# Patient Record
Sex: Female | Born: 1990 | Race: Black or African American | Hispanic: No | Marital: Single | Smoking: Never smoker
Health system: Southern US, Community
[De-identification: ages and names within clinical notes are randomized; demographics above are authoritative.]

## PROBLEM LIST (undated history)

## (undated) ENCOUNTER — Inpatient Hospital Stay (HOSPITAL_COMMUNITY): Payer: Self-pay

## (undated) DIAGNOSIS — J45909 Unspecified asthma, uncomplicated: Secondary | ICD-10-CM

## (undated) DIAGNOSIS — B999 Unspecified infectious disease: Secondary | ICD-10-CM

## (undated) DIAGNOSIS — D649 Anemia, unspecified: Secondary | ICD-10-CM

## (undated) HISTORY — PX: KNEE SURGERY: SHX244

## (undated) HISTORY — PX: KNEE ARTHROSCOPY: SUR90

---
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
## 2012-12-28 ENCOUNTER — Emergency Department (HOSPITAL_COMMUNITY)
Admission: EM | Admit: 2012-12-28 | Discharge: 2012-12-28 | Disposition: A | Discharge status: Home / Self Care | Payer: 59 | Attending: Emergency Medicine | Admitting: Emergency Medicine

## 2012-12-28 ENCOUNTER — Encounter (HOSPITAL_COMMUNITY): Payer: Self-pay | Admitting: Emergency Medicine

## 2012-12-28 DIAGNOSIS — Z79899 Other long term (current) drug therapy: Secondary | ICD-10-CM | POA: Insufficient documentation

## 2012-12-28 DIAGNOSIS — F3289 Other specified depressive episodes: Secondary | ICD-10-CM | POA: Insufficient documentation

## 2012-12-28 DIAGNOSIS — R350 Frequency of micturition: Secondary | ICD-10-CM | POA: Insufficient documentation

## 2012-12-28 DIAGNOSIS — F329 Major depressive disorder, single episode, unspecified: Secondary | ICD-10-CM | POA: Insufficient documentation

## 2012-12-28 DIAGNOSIS — Z3202 Encounter for pregnancy test, result negative: Secondary | ICD-10-CM | POA: Insufficient documentation

## 2012-12-28 DIAGNOSIS — N390 Urinary tract infection, site not specified: Secondary | ICD-10-CM | POA: Insufficient documentation

## 2012-12-28 LAB — URINALYSIS, ROUTINE W REFLEX MICROSCOPIC
Glucose, UA: NEGATIVE mg/dL
Ketones, ur: NEGATIVE mg/dL
Protein, ur: >300 mg/dL — AB
pH: 5.5 (ref 5.0–8.0)

## 2012-12-28 LAB — URINE MICROSCOPIC-ADD ON

## 2012-12-28 LAB — PREGNANCY, URINE: Preg Test, Ur: NEGATIVE

## 2012-12-28 MED ORDER — CEPHALEXIN 500 MG PO CAPS
500.0000 mg | ORAL_CAPSULE | Freq: Four times a day (QID) | ORAL | Status: DC
  Administered 2012-12-28: 500 mg via ORAL
  Filled 2012-12-28: qty 1

## 2012-12-28 MED ORDER — CEPHALEXIN 500 MG PO CAPS: 500.0000 mg | ORAL_CAPSULE | Freq: Four times a day (QID) | ORAL | Status: DC

## 2012-12-28 MED ORDER — PHENAZOPYRIDINE HCL 200 MG PO TABS: 200.0000 mg | ORAL_TABLET | Freq: Three times a day (TID) | ORAL | Status: DC

## 2012-12-28 MED ORDER — PHENAZOPYRIDINE HCL 200 MG PO TABS
200.0000 mg | ORAL_TABLET | Freq: Three times a day (TID) | ORAL | Status: DC
  Administered 2012-12-28: 200 mg via ORAL
  Filled 2012-12-28: qty 1

## 2012-12-28 NOTE — ED Provider Notes (Addendum)
CSN: [id number]     Arrival date & time 12/28/12  0056 History   First MD Initiated Contact with Patient 12/28/12 0131     Chief Complaint  Patient presents with  . Pelvic Pain    Patient is a 22 y.o. female presenting with pelvic pain. The history is provided by the patient.  Pelvic Pain This is a new problem. The current episode started 6 to 12 hours ago. The problem occurs constantly. The problem has not changed since onset.Associated symptoms include abdominal pain. Pertinent negatives include no chest pain, no headaches and no shortness of breath. Exacerbated by: urination. Nothing relieves the symptoms. She has tried nothing for the symptoms.    History reviewed. No pertinent past medical history. Past Surgical History  Procedure Laterality Date  . Knee surgery     Family History  Problem Relation Age of Onset  . Cancer Other    History  Substance Use Topics  . Smoking status: Never Smoker   . Smokeless tobacco: Not on file  . Alcohol Use: No   OB History   Grav Para Term Preterm Abortions TAB SAB Ect Mult Living                 Review of Systems  Constitutional: Negative for fever.  Respiratory: Negative for shortness of breath.   Cardiovascular: Negative for chest pain.  Gastrointestinal: Positive for abdominal pain. Negative for nausea, vomiting and diarrhea.  Genitourinary: Positive for dysuria, frequency and pelvic pain. Negative for flank pain.  Skin: Negative for rash.  Neurological: Negative for headaches.  All other systems reviewed and are negative.    Allergies  Review of patient's allergies indicates no known allergies.  Home Medications   Current Outpatient Rx  Name  Route  Sig  Dispense  Refill  . Estradiol Valerate-Dienogest (NATAZIA) 3/2-2/2-3/1 MG tablet   Oral   Take 1 tablet by mouth daily.         . cephALEXin (KEFLEX) 500 MG capsule   Oral   Take 1 capsule (500 mg total) by mouth every 6 (six) hours.   20 capsule   0   .  phenazopyridine (PYRIDIUM) 200 MG tablet   Oral   Take 1 tablet (200 mg total) by mouth 3 (three) times daily with meals.   10 tablet   0    BP 113/75  Pulse 87  Temp(Src) 98.1 F (36.7 C) (Oral)  Resp 14  SpO2 100%  LMP 11/15/2012 Physical Exam  Nursing note and vitals reviewed. Constitutional: She appears well-developed and well-nourished. No distress.  HENT:  Head: Normocephalic and atraumatic.  Right Ear: External ear normal.  Left Ear: External ear normal.  Eyes: Conjunctivae are normal. Right eye exhibits no discharge. Left eye exhibits no discharge. No scleral icterus.  Neck: Neck supple. No tracheal deviation present.  Cardiovascular: Normal rate.   Pulmonary/Chest: Effort normal. No stridor. No respiratory distress.  Abdominal: She exhibits no mass. There is no tenderness. There is no rebound and no guarding.  Musculoskeletal: She exhibits no edema.  Neurological: She is alert. Cranial nerve deficit: no gross deficits.  Skin: Skin is warm and dry. No rash noted.  Psychiatric: She has a normal mood and affect.    ED Course  Procedures (including critical care time) Labs Review Labs Reviewed  URINALYSIS, ROUTINE W REFLEX MICROSCOPIC - Abnormal; Notable for the following:    Color, Urine AMBER (*)    APPearance TURBID (*)    Specific Gravity, Urine 1.033 (*)  Hgb urine dipstick LARGE (*)    Bilirubin Urine SMALL (*)    Protein, ur >300 (*)    Leukocytes, UA MODERATE (*)    All other components within normal limits  URINE MICROSCOPIC-ADD ON - Abnormal; Notable for the following:    Squamous Epithelial / LPF FEW (*)    Bacteria, UA MANY (*)    All other components within normal limits  URINE CULTURE  PREGNANCY, URINE   Imaging Review No results found.  MDM   1. UTI (urinary tract infection)    Symptoms and findings are consistent with a simple cystitis. We'll discharge the patient home on a prescription for Keflex and 30    Celene Kras,  MD 12/28/12 4098  Celene Kras, MD 01/02/13 [phone]

## 2012-12-28 NOTE — ED Notes (Signed)
Pt states she is having pain in her lower abd/pelvic area describes as pressure  Pt states it started around 1900 tonight  Pt states she feels like she needs to urinate all the time and only a little comes out  Pt has burning with urination

## 2012-12-30 LAB — URINE CULTURE

## 2012-12-31 NOTE — ED Notes (Signed)
Patient treated with Cephalexin-sensitive to same-chart appended per protocol MD. 

## 2013-04-04 NOTE — L&D Delivery Note (Signed)
Operative Delivery Note At 8:48 PM a viable and healthy female was delivered via Vaginal, Vacuum Investment banker, operational).  Presentation: vertex; Position: Occiput,, Posterior; Station: +3.  Verbal consent: obtained from patient.  Risks and benefits discussed in detail.  Risks include, but are not limited to the risks of anesthesia, bleeding, infection, damage to maternal tissues, fetal cephalhematoma.  There is also the risk of inability to effect vaginal delivery of the head, or shoulder dystocia that cannot be resolved by established maneuvers, leading to the need for emergency cesarean section.  Patient was noted to be having severe variables with contractions. These became more severe with prolonged pushing. The decision was made to expedite delivery. Following informed consent the vacuum was applied. With pushing the vacuum suction was engaged and gentle traction was applied to assist with delivering the fetal vertex. The vacuum popped off approximately 4 times prior to achieving delivery of the head. It was difficult to get an adequate application and suction of the vacuum to to the amount of hair on the infant's head. After the second pop-off a second-degree episiotomy was performed. With the final application of the vacuum the infant's head was delivered to crowning in the direct occiput posterior presentation. The vacuum was disengaged and the remainder of the body was delivered. The infant was limp and the cord was immediately clamped and cut and the infant was passed to the waiting isolette for resuscitation and a code Apgar was called. Please see the neonatal resuscitation record for complete details. The NICU team deemed the infant stable to remain at bedside with the mother. Mother and baby are doing well following delivery.  APGAR: 4, 7; weight pending.   Placenta status: spontaneous, intact.   Cord: 3 vessels with the following complications: .  Cord pH: 6.91. At the end of the cord gas draw there  appeared to be venous contamination of the blood draw  Anesthesia: Epidural Local  Instruments: kiwi vacuum Episiotomy: Median Lacerations: 2nd degree Suture Repair: 3.0 vicryl Est. Blood Loss (mL): 300  Mom to postpartum.  Baby to Couplet care / Skin to Skin.  Tya Haughey H. 11/26/2013, 9:18 PM

## 2013-05-10 LAB — OB RESULTS CONSOLE RPR: RPR: NONREACTIVE

## 2013-05-10 LAB — OB RESULTS CONSOLE GC/CHLAMYDIA
CHLAMYDIA, DNA PROBE: NEGATIVE
GC PROBE AMP, GENITAL: NEGATIVE

## 2013-05-10 LAB — OB RESULTS CONSOLE ABO/RH: "RH Type ": POSITIVE

## 2013-05-10 LAB — OB RESULTS CONSOLE HIV ANTIBODY (ROUTINE TESTING): HIV: NONREACTIVE

## 2013-05-10 LAB — OB RESULTS CONSOLE HEPATITIS B SURFACE ANTIGEN: Hepatitis B Surface Ag: NEGATIVE

## 2013-05-10 LAB — OB RESULTS CONSOLE ANTIBODY SCREEN: Antibody Screen: NEGATIVE

## 2013-05-10 LAB — OB RESULTS CONSOLE RUBELLA ANTIBODY, IGM: Rubella: IMMUNE

## 2013-10-08 ENCOUNTER — Encounter (HOSPITAL_COMMUNITY): Payer: Self-pay | Admitting: *Deleted

## 2013-10-08 ENCOUNTER — Inpatient Hospital Stay (HOSPITAL_COMMUNITY)
Admission: AD | Admit: 2013-10-08 | Discharge: 2013-10-09 | Disposition: A | Discharge status: Home / Self Care | Payer: 59 | Source: Ambulatory Visit | Attending: Obstetrics and Gynecology | Admitting: Obstetrics and Gynecology

## 2013-10-08 DIAGNOSIS — N898 Other specified noninflammatory disorders of vagina: Secondary | ICD-10-CM | POA: Insufficient documentation

## 2013-10-08 DIAGNOSIS — N949 Unspecified condition associated with female genital organs and menstrual cycle: Secondary | ICD-10-CM | POA: Diagnosis present

## 2013-10-08 DIAGNOSIS — O4700 False labor before 37 completed weeks of gestation, unspecified trimester: Secondary | ICD-10-CM | POA: Diagnosis not present

## 2013-10-08 DIAGNOSIS — O26893 Other specified pregnancy related conditions, third trimester: Secondary | ICD-10-CM

## 2013-10-08 DIAGNOSIS — O479 False labor, unspecified: Secondary | ICD-10-CM

## 2013-10-08 LAB — WET PREP, GENITAL
CLUE CELLS WET PREP: NONE SEEN
TRICH WET PREP: NONE SEEN
Yeast Wet Prep HPF POC: NONE SEEN

## 2013-10-08 LAB — URINALYSIS, ROUTINE W REFLEX MICROSCOPIC
BILIRUBIN URINE: NEGATIVE
GLUCOSE, UA: NEGATIVE mg/dL
Hgb urine dipstick: NEGATIVE
Ketones, ur: NEGATIVE mg/dL
LEUKOCYTES UA: NEGATIVE
Nitrite: NEGATIVE
Protein, ur: NEGATIVE mg/dL
SPECIFIC GRAVITY, URINE: 1.02 (ref 1.005–1.030)
Urobilinogen, UA: 0.2 mg/dL (ref 0.0–1.0)
pH: 7.5 (ref 5.0–8.0)

## 2013-10-08 NOTE — MAU Note (Signed)
Pt c/o having cxt's and tan colored vaginal discharge for past two days.  C/o abdominal pain 5/10 on the pain scale.

## 2013-10-08 NOTE — MAU Note (Signed)
Pt reports having 3 contractions today, lower back pain and vaginal discharge x 3 days. Reports good fetal movement

## 2013-10-08 NOTE — MAU Provider Note (Signed)
Chief Complaint:  Contractions, Vaginal Discharge, Back Pain and Pelvic Pain   First Provider Initiated Contact with Patient 10/08/13 2310      HPI: Danielle Christensen is a 23 y.o. G1P0 at 7723w2d who presents to maternity admissions reporting pelvic pain/pressure, back pain, cramping, and white vaginal discharge x2 days.  She denies itching/burning/odor with the discharge.  She reports good fetal movement, denies LOF, vaginal bleeding, urinary symptoms, h/a, dizziness, n/v, or fever/chills.    Past Medical History: No past medical history on file.  Past obstetric history: OB History  Gravida Para Term Preterm AB SAB TAB Ectopic Multiple Living  2 1            # Outcome Date GA Lbr Len/2nd Weight Sex Delivery Anes PTL Lv  2 CUR           1 PAR               Past Surgical History: Past Surgical History  Procedure Laterality Date  . Knee surgery      Family History: Family History  Problem Relation Age of Onset  . Cancer Other     Social History: History  Substance Use Topics  . Smoking status: Never Smoker   . Smokeless tobacco: Not on file  . Alcohol Use: No    Allergies: No Known Allergies  Meds:  Prescriptions prior to admission  Medication Sig Dispense Refill  . albuterol (PROVENTIL HFA;VENTOLIN HFA) 108 (90 BASE) MCG/ACT inhaler Inhale 2 puffs into the lungs every 6 (six) hours as needed for wheezing or shortness of breath.      . Prenatal Vit-Fe Fumarate-FA (PRENATAL MULTIVITAMIN) TABS tablet Take 2 tablets by mouth daily at 12 noon.        ROS: Pertinent findings in history of present illness.  Physical Exam  Blood pressure 122/64, pulse 100, temperature 98.6 F (37 C), temperature source Oral, resp. rate 18, height 5\' 4"  (1.626 m), weight 65.772 kg (145 lb), last menstrual period 11/15/2012, SpO2 99.00%. GENERAL: Well-developed, well-nourished female in no acute distress.  HEENT: normocephalic HEART: normal rate RESP: normal effort ABDOMEN: Soft,  non-tender, gravid appropriate for gestational age EXTREMITIES: Nontender, no edema NEURO: alert and oriented Pelvic exam: Cervix pink, visually closed, without lesion, scant white creamy discharge, vaginal walls and external genitalia normal Cervix 0/thick/high/posterior  Dilation: Closed Effacement (%): Thick Exam by:: Leftwich-Kirby  FHT:  Baseline 140 , moderate variability, accelerations present, no decelerations Contractions: Some intermittent irritability, mild to palpation   Labs: Results for orders placed during the hospital encounter of 10/08/13 (from the past 24 hour(s))  URINALYSIS, ROUTINE W REFLEX MICROSCOPIC     Status: None   Collection Time    10/08/13  8:06 PM      Result Value Ref Range   Color, Urine YELLOW  YELLOW   APPearance CLEAR  CLEAR   Specific Gravity, Urine 1.020  1.005 - 1.030   pH 7.5  5.0 - 8.0   Glucose, UA NEGATIVE  NEGATIVE mg/dL   Hgb urine dipstick NEGATIVE  NEGATIVE   Bilirubin Urine NEGATIVE  NEGATIVE   Ketones, ur NEGATIVE  NEGATIVE mg/dL   Protein, ur NEGATIVE  NEGATIVE mg/dL   Urobilinogen, UA 0.2  0.0 - 1.0 mg/dL   Nitrite NEGATIVE  NEGATIVE   Leukocytes, UA NEGATIVE  NEGATIVE  WET PREP, GENITAL     Status: Abnormal   Collection Time    10/08/13 11:20 PM      Result Value Ref Range  Yeast Wet Prep HPF POC NONE SEEN  NONE SEEN   Trich, Wet Prep NONE SEEN  NONE SEEN   Clue Cells Wet Prep HPF POC NONE SEEN  NONE SEEN   WBC, Wet Prep HPF POC FEW (*) NONE SEEN    Assessment: 1. Braxton Hicks contractions   2. Pelvic pressure in pregnancy, antepartum, third trimester     Plan: Discharge home PTL precautions and fetal kick counts Keep scheduled appt this week Return to MAU as needed for emergencies      Follow-up Information   Follow up with Levi AlandANDERSON,MARK E, MD.   Specialty:  Obstetrics and Gynecology   Contact information:   [address] [place] [id number] [phone]        Medication List          albuterol 108 (90 BASE) MCG/ACT inhaler  Commonly known as:  PROVENTIL HFA;VENTOLIN HFA  Inhale 2 puffs into the lungs every 6 (six) hours as needed for wheezing or shortness of breath.     prenatal multivitamin Tabs tablet  Take 2 tablets by mouth daily at 12 noon.        Sharen CounterLisa Leftwich-Kirby Certified Nurse-Midwife 10/08/2013 11:44 PM

## 2013-10-08 NOTE — Discharge Instructions (Signed)
Braxton Hicks Contractions °Contractions of the uterus can occur throughout pregnancy. Contractions are not always a sign that you are in labor.  °WHAT ARE BRAXTON HICKS CONTRACTIONS?  °Contractions that occur before labor are called Braxton Hicks contractions, or false labor. Toward the end of pregnancy (32-34 weeks), these contractions can develop more often and may become more forceful. This is not true labor because these contractions do not result in opening (dilatation) and thinning of the cervix. They are sometimes difficult to tell apart from true labor because these contractions can be forceful and people have different pain tolerances. You should not feel embarrassed if you go to the hospital with false labor. Sometimes, the only way to tell if you are in true labor is for your health care provider to look for changes in the cervix. °If there are no prenatal problems or other health problems associated with the pregnancy, it is completely safe to be sent home with false labor and await the onset of true labor. °HOW CAN YOU TELL THE DIFFERENCE BETWEEN TRUE AND FALSE LABOR? °False Labor °· The contractions of false labor are usually shorter and not as hard as those of true labor.   °· The contractions are usually irregular.   °· The contractions are often felt in the front of the lower abdomen and in the groin.   °· The contractions may go away when you walk around or change positions while lying down.   °· The contractions get weaker and are shorter lasting as time goes on.   °· The contractions do not usually become progressively stronger, regular, and closer together as with true labor.   °True Labor °· Contractions in true labor last 30-70 seconds, become very regular, usually become more intense, and increase in frequency.   °· The contractions do not go away with walking.   °· The discomfort is usually felt in the top of the uterus and spreads to the lower abdomen and low back.   °· True labor can be  determined by your health care provider with an exam. This will show that the cervix is dilating and getting thinner.   °WHAT TO REMEMBER °· Keep up with your usual exercises and follow other instructions given by your health care provider.   °· Take medicines as directed by your health care provider.   °· Keep your regular prenatal appointments.   °· Eat and drink lightly if you think you are going into labor.   °· If Braxton Hicks contractions are making you uncomfortable:   °¨ Change your position from lying down or resting to walking, or from walking to resting.   °¨ Sit and rest in a tub of warm water.   °¨ Drink 2-3 glasses of water. Dehydration may cause these contractions.   °¨ Do slow and deep breathing several times an hour.   °WHEN SHOULD I SEEK IMMEDIATE MEDICAL CARE? °Seek immediate medical care if: °· Your contractions become stronger, more regular, and closer together.   °· You have fluid leaking or gushing from your vagina.   °· You have a fever.   °· You pass blood-tinged mucus.   °· You have vaginal bleeding.   °· You have continuous abdominal pain.   °· You have low back pain that you never had before.   °· You feel your baby's head pushing down and causing pelvic pressure.   °· Your baby is not moving as much as it used to.   °Document Released: 03/21/2005 Document Revised: 03/26/2013 Document Reviewed: 12/31/2012 °[employer]® Patient Information ©2015 [employer]. This information is not intended to replace advice given to you by your health care   provider. Make sure you discuss any questions you have with your health care provider. ° °

## 2013-10-24 LAB — OB RESULTS CONSOLE GBS: STREP GROUP B AG: NEGATIVE

## 2013-11-24 ENCOUNTER — Inpatient Hospital Stay (HOSPITAL_COMMUNITY)
Admission: AD | Admit: 2013-11-24 | Discharge: 2013-11-24 | Disposition: A | Discharge status: Home / Self Care | Payer: 59 | Source: Ambulatory Visit | Attending: Rheumatology | Admitting: Rheumatology

## 2013-11-24 ENCOUNTER — Encounter (HOSPITAL_COMMUNITY): Payer: Self-pay | Admitting: *Deleted

## 2013-11-24 DIAGNOSIS — O479 False labor, unspecified: Secondary | ICD-10-CM | POA: Insufficient documentation

## 2013-11-24 NOTE — MAU Note (Signed)
Pt presented after SROM at 0415. Pt states that fluid is clear and denies bleeding. Baby has been active. Pt is not having contractions.

## 2013-11-24 NOTE — MAU Note (Addendum)
Pt said she is having contractions having started yesterday at 1500.

## 2013-11-24 NOTE — Discharge Instructions (Signed)
Braxton Hicks Contractions Contractions of the uterus can occur throughout pregnancy. Contractions are not always a sign that you are in labor.  WHAT ARE BRAXTON HICKS CONTRACTIONS?  Contractions that occur before labor are called Braxton Hicks contractions, or false labor. Toward the end of pregnancy (32-34 weeks), these contractions can develop more often and may become more forceful. This is not true labor because these contractions do not result in opening (dilatation) and thinning of the cervix. They are sometimes difficult to tell apart from true labor because these contractions can be forceful and people have different pain tolerances. You should not feel embarrassed if you go to the hospital with false labor. Sometimes, the only way to tell if you are in true labor is for your health care provider to look for changes in the cervix. If there are no prenatal problems or other health problems associated with the pregnancy, it is completely safe to be sent home with false labor and await the onset of true labor. HOW CAN YOU TELL THE DIFFERENCE BETWEEN TRUE AND FALSE LABOR? False Labor  The contractions of false labor are usually shorter and not as hard as those of true labor.   The contractions are usually irregular.   The contractions are often felt in the front of the lower abdomen and in the groin.   The contractions may go away when you walk around or change positions while lying down.   The contractions get weaker and are shorter lasting as time goes on.   The contractions do not usually become progressively stronger, regular, and closer together as with true labor.  True Labor  Contractions in true labor last 30-70 seconds, become very regular, usually become more intense, and increase in frequency.   The contractions do not go away with walking.   The discomfort is usually felt in the top of the uterus and spreads to the lower abdomen and low back.   True labor can be  determined by your health care provider with an exam. This will show that the cervix is dilating and getting thinner.  WHAT TO REMEMBER  Keep up with your usual exercises and follow other instructions given by your health care provider.   Take medicines as directed by your health care provider.   Keep your regular prenatal appointments.   Eat and drink lightly if you think you are going into labor.   If Braxton Hicks contractions are making you uncomfortable:   Change your position from lying down or resting to walking, or from walking to resting.   Sit and rest in a tub of warm water.   Drink 2-3 glasses of water. Dehydration may cause these contractions.   Do slow and deep breathing several times an hour.  WHEN SHOULD I SEEK IMMEDIATE MEDICAL CARE? Seek immediate medical care if:  Your contractions become stronger, more regular, and closer together.   You have fluid leaking or gushing from your vagina.   You have a fever.   You pass blood-tinged mucus.   You have vaginal bleeding.   You have continuous abdominal pain.   You have low back pain that you never had before.   You feel your baby's head pushing down and causing pelvic pressure.   Your baby is not moving as much as it used to.  Document Released: 03/21/2005 Document Revised: 03/26/2013 Document Reviewed: 12/31/2012 Exit[employer] Patient Information 2015 [employer]. This information is not intended to replace advice given to you by your health care   provider. Make sure you discuss any questions you have with your health care provider.  Fetal Movement Counts Patient Name: __________________________________________________ Patient Due Date: ____________________ Performing a fetal movement count is highly recommended in high-risk pregnancies, but it is good for every pregnant woman to do. Your health care provider may ask you to start counting fetal movements at 28 weeks of the pregnancy. Fetal  movements often increase:  After eating a full meal.  After physical activity.  After eating or drinking something sweet or cold.  At rest. Pay attention to when you feel the baby is most active. This will help you notice a pattern of your baby's sleep and wake cycles and what factors contribute to an increase in fetal movement. It is important to perform a fetal movement count at the same time each day when your baby is normally most active.  HOW TO COUNT FETAL MOVEMENTS 1. Find a quiet and comfortable area to sit or lie down on your left side. Lying on your left side provides the best blood and oxygen circulation to your baby. 2. Write down the day and time on a sheet of paper or in a journal. 3. Start counting kicks, flutters, swishes, rolls, or jabs in a 2-hour period. You should feel at least 10 movements within 2 hours. 4. If you do not feel 10 movements in 2 hours, wait 2-3 hours and count again. Look for a change in the pattern or not enough counts in 2 hours. SEEK MEDICAL CARE IF:  You feel less than 10 counts in 2 hours, tried twice.  There is no movement in over an hour.  The pattern is changing or taking longer each day to reach 10 counts in 2 hours.  You feel the baby is not moving as he or she usually does. Date: ____________ Movements: ____________ Start time: ____________ Finish time: ____________  Date: ____________ Movements: ____________ Start time: ____________ Finish time: ____________ Date: ____________ Movements: ____________ Start time: ____________ Finish time: ____________ Date: ____________ Movements: ____________ Start time: ____________ Finish time: ____________ Date: ____________ Movements: ____________ Start time: ____________ Finish time: ____________ Date: ____________ Movements: ____________ Start time: ____________ Finish time: ____________ Date: ____________ Movements: ____________ Start time: ____________ Finish time: ____________ Date: ____________  Movements: ____________ Start time: ____________ Finish time: ____________  Date: ____________ Movements: ____________ Start time: ____________ Finish time: ____________ Date: ____________ Movements: ____________ Start time: ____________ Finish time: ____________ Date: ____________ Movements: ____________ Start time: ____________ Finish time: ____________ Date: ____________ Movements: ____________ Start time: ____________ Finish time: ____________ Date: ____________ Movements: ____________ Start time: ____________ Finish time: ____________ Date: ____________ Movements: ____________ Start time: ____________ Finish time: ____________ Date: ____________ Movements: ____________ Start time: ____________ Finish time: ____________  Date: ____________ Movements: ____________ Start time: ____________ Finish time: ____________ Date: ____________ Movements: ____________ Start time: ____________ Finish time: ____________ Date: ____________ Movements: ____________ Start time: ____________ Finish time: ____________ Date: ____________ Movements: ____________ Start time: ____________ Finish time: ____________ Date: ____________ Movements: ____________ Start time: ____________ Finish time: ____________ Date: ____________ Movements: ____________ Start time: ____________ Finish time: ____________ Date: ____________ Movements: ____________ Start time: ____________ Finish time: ____________  Date: ____________ Movements: ____________ Start time: ____________ Finish time: ____________ Date: ____________ Movements: ____________ Start time: ____________ Finish time: ____________ Date: ____________ Movements: ____________ Start time: ____________ Finish time: ____________ Date: ____________ Movements: ____________ Start time: ____________ Finish time: ____________ Date: ____________ Movements: ____________ Start time: ____________ Finish time: ____________ Date: ____________ Movements: ____________ Start time:  ____________ Finish time: ____________ Date: ____________ Movements:   ____________ Start time: ____________ Finish time: ____________  Date: ____________ Movements: ____________ Start time: ____________ Finish time: ____________ Date: ____________ Movements: ____________ Start time: ____________ Finish time: ____________ Date: ____________ Movements: ____________ Start time: ____________ Finish time: ____________ Date: ____________ Movements: ____________ Start time: ____________ Finish time: ____________ Date: ____________ Movements: ____________ Start time: ____________ Finish time: ____________ Date: ____________ Movements: ____________ Start time: ____________ Finish time: ____________ Date: ____________ Movements: ____________ Start time: ____________ Finish time: ____________  Date: ____________ Movements: ____________ Start time: ____________ Finish time: ____________ Date: ____________ Movements: ____________ Start time: ____________ Finish time: ____________ Date: ____________ Movements: ____________ Start time: ____________ Finish time: ____________ Date: ____________ Movements: ____________ Start time: ____________ Finish time: ____________ Date: ____________ Movements: ____________ Start time: ____________ Finish time: ____________ Date: ____________ Movements: ____________ Start time: ____________ Finish time: ____________ Date: ____________ Movements: ____________ Start time: ____________ Finish time: ____________  Date: ____________ Movements: ____________ Start time: ____________ Finish time: ____________ Date: ____________ Movements: ____________ Start time: ____________ Finish time: ____________ Date: ____________ Movements: ____________ Start time: ____________ Finish time: ____________ Date: ____________ Movements: ____________ Start time: ____________ Finish time: ____________ Date: ____________ Movements: ____________ Start time: ____________ Finish time: ____________ Date:  ____________ Movements: ____________ Start time: ____________ Finish time: ____________ Date: ____________ Movements: ____________ Start time: ____________ Finish time: ____________  Date: ____________ Movements: ____________ Start time: ____________ Finish time: ____________ Date: ____________ Movements: ____________ Start time: ____________ Finish time: ____________ Date: ____________ Movements: ____________ Start time: ____________ Finish time: ____________ Date: ____________ Movements: ____________ Start time: ____________ Finish time: ____________ Date: ____________ Movements: ____________ Start time: ____________ Finish time: ____________ Date: ____________ Movements: ____________ Start time: ____________ Finish time: ____________ Document Released: 04/20/2006 Document Revised: 08/05/2013 Document Reviewed: 01/16/2012 Exit[employer] Patient Information 2015 [employer]. This information is not intended to replace advice given to you by your health care provider. Make sure you discuss any questions you have with your health care provider.  

## 2013-11-26 ENCOUNTER — Encounter (HOSPITAL_COMMUNITY): Payer: 59 | Admitting: Anesthesiology

## 2013-11-26 ENCOUNTER — Inpatient Hospital Stay (HOSPITAL_COMMUNITY): Payer: 59 | Admitting: Anesthesiology

## 2013-11-26 ENCOUNTER — Inpatient Hospital Stay (HOSPITAL_COMMUNITY)
Admission: AD | Admit: 2013-11-26 | Discharge: 2013-11-28 | DRG: 775 | Disposition: A | Discharge status: Home / Self Care | Payer: 59 | Source: Ambulatory Visit | Attending: Obstetrics and Gynecology | Admitting: Obstetrics and Gynecology

## 2013-11-26 ENCOUNTER — Encounter (HOSPITAL_COMMUNITY): Payer: Self-pay | Admitting: *Deleted

## 2013-11-26 DIAGNOSIS — O479 False labor, unspecified: Secondary | ICD-10-CM | POA: Diagnosis present

## 2013-11-26 DIAGNOSIS — J45909 Unspecified asthma, uncomplicated: Secondary | ICD-10-CM | POA: Diagnosis present

## 2013-11-26 DIAGNOSIS — IMO0001 Reserved for inherently not codable concepts without codable children: Secondary | ICD-10-CM

## 2013-11-26 DIAGNOSIS — Z8759 Personal history of other complications of pregnancy, childbirth and the puerperium: Secondary | ICD-10-CM

## 2013-11-26 HISTORY — DX: Anemia, unspecified: D64.9

## 2013-11-26 HISTORY — DX: Unspecified infectious disease: B99.9

## 2013-11-26 HISTORY — DX: Unspecified asthma, uncomplicated: J45.909

## 2013-11-26 LAB — CBC
HCT: 38 % (ref 36.0–46.0)
Hemoglobin: 13.2 g/dL (ref 12.0–15.0)
MCH: 32 pg (ref 26.0–34.0)
MCHC: 34.7 g/dL (ref 30.0–36.0)
MCV: 92 fL (ref 78.0–100.0)
Platelets: 189 10*3/uL (ref 150–400)
RBC: 4.13 MIL/uL (ref 3.87–5.11)
RDW: 13.5 % (ref 11.5–15.5)
WBC: 13.3 10*3/uL — AB (ref 4.0–10.5)

## 2013-11-26 LAB — RPR

## 2013-11-26 LAB — POCT FERN TEST: POCT Fern Test: POSITIVE

## 2013-11-26 MED ORDER — ZOLPIDEM TARTRATE 5 MG PO TABS: 5.0000 mg | ORAL_TABLET | Freq: Every evening | ORAL | Status: DC | PRN

## 2013-11-26 MED ORDER — IBUPROFEN 600 MG PO TABS
600.0000 mg | ORAL_TABLET | Freq: Four times a day (QID) | ORAL | Status: DC
  Administered 2013-11-27 – 2013-11-28 (×7): 600 mg via ORAL
  Filled 2013-11-26 (×7): qty 1

## 2013-11-26 MED ORDER — DIPHENHYDRAMINE HCL 50 MG/ML IJ SOLN: 12.5000 mg | INTRAMUSCULAR | Status: DC | PRN

## 2013-11-26 MED ORDER — TERBUTALINE SULFATE 1 MG/ML IJ SOLN: 0.2500 mg | Freq: Once | INTRAMUSCULAR | Status: AC | PRN

## 2013-11-26 MED ORDER — OXYCODONE-ACETAMINOPHEN 5-325 MG PO TABS: 1.0000 | ORAL_TABLET | ORAL | Status: DC | PRN

## 2013-11-26 MED ORDER — CITRIC ACID-SODIUM CITRATE 334-500 MG/5ML PO SOLN
30.0000 mL | ORAL | Status: DC | PRN
Start: 2013-11-26 — End: 2013-11-27

## 2013-11-26 MED ORDER — OXYTOCIN 40 UNITS IN LACTATED RINGERS INFUSION - SIMPLE MED: 1.0000 m[IU]/min | INTRAVENOUS | Status: DC

## 2013-11-26 MED ORDER — FLEET ENEMA 7-19 GM/118ML RE ENEM: 1.0000 | ENEMA | RECTAL | Status: DC | PRN

## 2013-11-26 MED ORDER — PHENYLEPHRINE 40 MCG/ML (10ML) SYRINGE FOR IV PUSH (FOR BLOOD PRESSURE SUPPORT)
80.0000 ug | PREFILLED_SYRINGE | INTRAVENOUS | Status: DC | PRN
  Filled 2013-11-26: qty 2

## 2013-11-26 MED ORDER — LIDOCAINE HCL (PF) 1 % IJ SOLN
INTRAMUSCULAR | Status: DC | PRN
  Administered 2013-11-26 (×4): 4 mL

## 2013-11-26 MED ORDER — LACTATED RINGERS IV SOLN
INTRAVENOUS | Status: DC
  Administered 2013-11-26 (×2): via INTRAUTERINE

## 2013-11-26 MED ORDER — ONDANSETRON HCL 4 MG/2ML IJ SOLN: 4.0000 mg | Freq: Four times a day (QID) | INTRAMUSCULAR | Status: DC | PRN

## 2013-11-26 MED ORDER — LACTATED RINGERS IV SOLN
INTRAVENOUS | Status: DC
  Administered 2013-11-26 (×2): via INTRAVENOUS

## 2013-11-26 MED ORDER — IBUPROFEN 600 MG PO TABS
600.0000 mg | ORAL_TABLET | Freq: Four times a day (QID) | ORAL | Status: DC | PRN
  Administered 2013-11-26: 600 mg via ORAL
  Filled 2013-11-26: qty 1

## 2013-11-26 MED ORDER — PHENYLEPHRINE 40 MCG/ML (10ML) SYRINGE FOR IV PUSH (FOR BLOOD PRESSURE SUPPORT)
80.0000 ug | PREFILLED_SYRINGE | INTRAVENOUS | Status: DC | PRN
  Filled 2013-11-26: qty 2
  Filled 2013-11-26: qty 10

## 2013-11-26 MED ORDER — FENTANYL 2.5 MCG/ML BUPIVACAINE 1/10 % EPIDURAL INFUSION (WH - ANES)
14.0000 mL/h | INTRAMUSCULAR | Status: DC | PRN
  Administered 2013-11-26: 14 mL/h via EPIDURAL
  Filled 2013-11-26: qty 125

## 2013-11-26 MED ORDER — EPHEDRINE 5 MG/ML INJ
10.0000 mg | INTRAVENOUS | Status: DC | PRN
  Filled 2013-11-26: qty 2

## 2013-11-26 MED ORDER — LIDOCAINE HCL (PF) 1 % IJ SOLN
30.0000 mL | INTRAMUSCULAR | Status: AC | PRN
  Administered 2013-11-26: 30 mL via SUBCUTANEOUS
  Filled 2013-11-26: qty 30

## 2013-11-26 MED ORDER — OXYTOCIN 40 UNITS IN LACTATED RINGERS INFUSION - SIMPLE MED
62.5000 mL/h | INTRAVENOUS | Status: DC
Start: 2013-11-26 — End: 2013-11-27
  Administered 2013-11-26: 62.5 mL/h via INTRAVENOUS
  Filled 2013-11-26: qty 1000

## 2013-11-26 MED ORDER — ACETAMINOPHEN 325 MG PO TABS: 650.0000 mg | ORAL_TABLET | ORAL | Status: DC | PRN

## 2013-11-26 MED ORDER — OXYTOCIN BOLUS FROM INFUSION: 500.0000 mL | INTRAVENOUS | Status: DC

## 2013-11-26 MED ORDER — LACTATED RINGERS IV SOLN
500.0000 mL | INTRAVENOUS | Status: DC | PRN
  Administered 2013-11-26: 500 mL via INTRAVENOUS

## 2013-11-26 MED ORDER — LACTATED RINGERS IV SOLN
500.0000 mL | Freq: Once | INTRAVENOUS | Status: AC
  Administered 2013-11-26: 500 mL via INTRAVENOUS

## 2013-11-26 NOTE — MAU Note (Signed)
Contracting during the night.  Gush of clear fluid when changing clothes in MAU. no problems with preg.

## 2013-11-26 NOTE — Anesthesia Preprocedure Evaluation (Signed)
Anesthesia Evaluation  Patient identified by MRN, date of birth, ID band Patient awake    Reviewed: Allergy & Precautions, H&P , NPO status , Patient's Chart, lab work & pertinent test results, reviewed documented beta blocker date and time   History of Anesthesia Complications Negative for: history of anesthetic complications  Airway Mallampati: III TM Distance: >3 FB Neck ROM: full    Dental  (+) Teeth Intact   Pulmonary asthma (occasional inhaler use) ,  breath sounds clear to auscultation        Cardiovascular negative cardio ROS  Rhythm:regular Rate:Normal     Neuro/Psych negative neurological ROS  negative psych ROS   GI/Hepatic negative GI ROS, Neg liver ROS,   Endo/Other  negative endocrine ROS  Renal/GU negative Renal ROS     Musculoskeletal   Abdominal   Peds  Hematology negative hematology ROS (+)   Anesthesia Other Findings   Reproductive/Obstetrics (+) Pregnancy                           Anesthesia Physical Anesthesia Plan  ASA: II  Anesthesia Plan: Epidural   Post-op Pain Management:    Induction:   Airway Management Planned:   Additional Equipment:   Intra-op Plan:   Post-operative Plan:   Informed Consent: I have reviewed the patients History and Physical, chart, labs and discussed the procedure including the risks, benefits and alternatives for the proposed anesthesia with the patient or authorized representative who has indicated his/her understanding and acceptance.     Plan Discussed with:   Anesthesia Plan Comments:         Anesthesia Quick Evaluation

## 2013-11-26 NOTE — MAU Note (Signed)
Patient states she is having contractions every 5 minutes. Denies bleeding or leaking and reports good fetal movement.  

## 2013-11-26 NOTE — H&P (Signed)
Danielle Christensen is a 23 y.o. female presenting for labor  23 yo G2P0010 @ 40+2 presents for painful contractions and was found to be in labor. Pt broke her water in MAU and was transferred to L&D History OB History   Grav Para Term Preterm Abortions TAB SAB Ect Mult Living   2 0 0 0 1 0 1 0 0      Past Medical History  Diagnosis Date  . Anemia   . Asthma   . Infection     UTI   Past Surgical History  Procedure Laterality Date  . Knee surgery    . Knee arthroscopy     Family History: family history includes Cancer in her maternal grandmother. There is no history of Hearing loss. Social History:  reports that she has never smoked. She has never used smokeless tobacco. She reports that she does not drink alcohol or use illicit drugs.   Prenatal Transfer Tool  Maternal Diabetes: No Genetic Screening: Normal Maternal Ultrasounds/Referrals: Normal Fetal Ultrasounds or other Referrals:  None Maternal Substance Abuse:  No Significant Maternal Medications:  None Significant Maternal Lab Results:  None Other Comments:  None  ROS: as above  Dilation: 10 Effacement (%): 100 Station: +2 Exam by:: Doraine Schexnider Blood pressure 108/62, pulse 96, temperature 98.5 F (36.9 C), temperature source Oral, resp. rate 20, height  (1.626 m), weight 65.772 kg (145 lb), last menstrual period 11/15/2012, SpO2 100.00%. Exam Physical Exam  Prenatal labs: ABO, Rh: A/Positive/-- (02/06 0000) Antibody: Negative (02/06 0000) Rubella: Immune (02/06 0000) RPR: Nonreactive (02/06 0000)  HBsAg: Negative (02/06 0000)  HIV: Non-reactive (02/06 0000)  GBS: Negative (07/23 0000)   Assessment/Plan: 1) Admit 2) Epidural 3) Pit prn   Izabelle Daus H. 11/26/2013, 7:58 PM

## 2013-11-26 NOTE — Anesthesia Procedure Notes (Signed)
Epidural Patient location during procedure: OB Start time: 11/26/2013 11:58 AM  Staffing Performed by: anesthesiologist   Preanesthetic Checklist Completed: patient identified, site marked, surgical consent, pre-op evaluation, timeout performed, IV checked, risks and benefits discussed and monitors and equipment checked  Epidural Patient position: sitting Prep: site prepped and draped and DuraPrep Patient monitoring: continuous pulse ox and blood pressure Approach: midline Location: L3-L4 Injection technique: LOR air  Needle:  Needle type: Tuohy  Needle gauge: 17 G Needle length: 9 cm and 9 Needle insertion depth: 4.5 cm Catheter type: closed end flexible Catheter size: 19 Gauge Catheter at skin depth: 9.5 cm Test dose: negative  Assessment Events: blood not aspirated, injection not painful, no injection resistance, negative IV test and no paresthesia  Additional Notes Discussed risk of headache, infection, bleeding, nerve injury and failed or incomplete block.  Patient voices understanding and wishes to proceed.  Epidural placed easily on first attempt.  No paresthesia.  Patient tolerated procedure well with no apparent complications.  A> Dois Juarbe, MDReason for block:procedure for pain

## 2013-11-27 ENCOUNTER — Encounter (HOSPITAL_COMMUNITY): Payer: Self-pay

## 2013-11-27 DIAGNOSIS — Z8759 Personal history of other complications of pregnancy, childbirth and the puerperium: Secondary | ICD-10-CM

## 2013-11-27 LAB — CBC
HCT: 32.6 % — ABNORMAL LOW (ref 36.0–46.0)
Hemoglobin: 11.2 g/dL — ABNORMAL LOW (ref 12.0–15.0)
MCH: 31.4 pg (ref 26.0–34.0)
MCHC: 34.4 g/dL (ref 30.0–36.0)
MCV: 91.3 fL (ref 78.0–100.0)
Platelets: 172 K/uL (ref 150–400)
RBC: 3.57 MIL/uL — ABNORMAL LOW (ref 3.87–5.11)
RDW: 13.3 % (ref 11.5–15.5)
WBC: 15.1 K/uL — ABNORMAL HIGH (ref 4.0–10.5)

## 2013-11-27 LAB — ABO/RH: ABO/RH(D): A POS

## 2013-11-27 MED ORDER — PRENATAL MULTIVITAMIN CH
1.0000 | ORAL_TABLET | Freq: Every day | ORAL | Status: DC
  Administered 2013-11-27 – 2013-11-28 (×2): 1 via ORAL
  Filled 2013-11-27 (×2): qty 1

## 2013-11-27 MED ORDER — BENZOCAINE-MENTHOL 20-0.5 % EX AERO
1.0000 "application " | INHALATION_SPRAY | CUTANEOUS | Status: DC | PRN
  Filled 2013-11-27: qty 56

## 2013-11-27 MED ORDER — ONDANSETRON HCL 4 MG PO TABS: 4.0000 mg | ORAL_TABLET | ORAL | Status: DC | PRN

## 2013-11-27 MED ORDER — PNEUMOCOCCAL VAC POLYVALENT 25 MCG/0.5ML IJ INJ
0.5000 mL | INJECTION | INTRAMUSCULAR | Status: DC
  Filled 2013-11-27: qty 0.5

## 2013-11-27 MED ORDER — SIMETHICONE 80 MG PO CHEW
80.0000 mg | CHEWABLE_TABLET | ORAL | Status: DC | PRN
  Administered 2013-11-28: 80 mg via ORAL
  Filled 2013-11-27: qty 1

## 2013-11-27 MED ORDER — DIBUCAINE 1 % RE OINT: 1.0000 "application " | TOPICAL_OINTMENT | RECTAL | Status: DC | PRN

## 2013-11-27 MED ORDER — WITCH HAZEL-GLYCERIN EX PADS: 1.0000 "application " | MEDICATED_PAD | CUTANEOUS | Status: DC | PRN

## 2013-11-27 MED ORDER — DIPHENHYDRAMINE HCL 25 MG PO CAPS: 25.0000 mg | ORAL_CAPSULE | Freq: Four times a day (QID) | ORAL | Status: DC | PRN

## 2013-11-27 MED ORDER — ZOLPIDEM TARTRATE 5 MG PO TABS: 5.0000 mg | ORAL_TABLET | Freq: Every evening | ORAL | Status: DC | PRN

## 2013-11-27 MED ORDER — ONDANSETRON HCL 4 MG/2ML IJ SOLN
4.0000 mg | INTRAMUSCULAR | Status: DC | PRN
Start: 2013-11-27 — End: 2013-11-28

## 2013-11-27 MED ORDER — TETANUS-DIPHTH-ACELL PERTUSSIS 5-2.5-18.5 LF-MCG/0.5 IM SUSP: 0.5000 mL | Freq: Once | INTRAMUSCULAR | Status: DC

## 2013-11-27 MED ORDER — SENNOSIDES-DOCUSATE SODIUM 8.6-50 MG PO TABS
2.0000 | ORAL_TABLET | ORAL | Status: DC
  Administered 2013-11-27: 2 via ORAL
  Filled 2013-11-27: qty 2

## 2013-11-27 MED ORDER — LANOLIN HYDROUS EX OINT
TOPICAL_OINTMENT | CUTANEOUS | Status: DC | PRN
Start: 2013-11-27 — End: 2013-11-28

## 2013-11-27 MED ORDER — OXYCODONE-ACETAMINOPHEN 5-325 MG PO TABS
1.0000 | ORAL_TABLET | ORAL | Status: DC | PRN
  Administered 2013-11-27 – 2013-11-28 (×4): 1 via ORAL
  Filled 2013-11-27 (×4): qty 1

## 2013-11-27 NOTE — Anesthesia Postprocedure Evaluation (Signed)
  Anesthesia Post-op Note  Patient: Danielle Christensen  Procedure(s) Performed: * No procedures listed *  Patient Location: Mother/Baby  Anesthesia Type:Epidural  Level of Consciousness: awake  Airway and Oxygen Therapy: Patient Spontanous Breathing  Post-op Pain: none  Post-op Assessment: Post-op Vital signs reviewed, Patient's Cardiovascular Status Stable, Respiratory Function Stable, Patent Airway, No signs of Nausea or vomiting, Adequate PO intake, Pain level controlled and No headache  Post-op Vital Signs: Reviewed and stable  Last Vitals:  Filed Vitals:   11/27/13 0425  BP: 91/60  Pulse: 96  Temp: 36.9 C  Resp: 18    Complications: No apparent anesthesia complications

## 2013-11-27 NOTE — Progress Notes (Signed)
Patient is eating, ambulating, voiding.  Pain control is good.  Appropriate lochia.  Does reports R hip and leg pain.  Has history of  Knee surgery on that side and has undergone PT in the past.    Filed Vitals:   11/26/13 2245 11/26/13 2330 11/27/13 0030 11/27/13 0425  BP: 118/96 98/78 112/64 91/60  Pulse: 75 96 98 96  Temp:  99 F (37.2 C) 97.6 F (36.4 C) 98.5 F (36.9 C)  TempSrc:  Axillary Axillary Axillary  Resp: Height:      Weight:      SpO2:        Fundus firm Perineum without swelling. Ext: no CT no weakness or numbness  Lab Results  Component Value Date   WBC 15.1* 11/27/2013   HGB 11.2* 11/27/2013   HCT 32.6* 11/27/2013   MCV 91.3 11/27/2013   PLT 172 11/27/2013    --/--/A POS (08/25 1030)  A/P Post partum day 1.  Routine care. Right hip/leg pain - discussed with patient.  She will call her PT and set up appt for eval and treatment.   Expect d/c 8/27.    Danielle Christensen

## 2013-11-27 NOTE — Lactation Note (Signed)
This note was copied from the chart of Danielle Christensen. Lactation Consultation Note Initial visit at 20 hours of age.  Baby just had bath and placed STS, baby asleep.  Mom reports a few attempt at latching, spoon feeding once and bottle of , that was reported to take a while.  Baby is not showing feeding cues, but mom reports he has been.  Attempted cross cradle latch baby too sleepy.  Instructed on use of hand pump flange is a good fit and short quick phase is not painful, but strong pulls on more intense handle hurts to much for mom to continue.  Encouraged mom to do hand expression or hand pumping every 3 hours if baby is not latching.  Also encouraged mom to call MBU RN for assist if not latching after 24 hours of age.  [facility] LC resources given and discussed.  Encouraged to feed with early cues on demand.  Cluster feeding discussed.   Early newborn behavior discussed.  Hand expression demonstrated with colostrum visible.  Considering baby didn't do well with bottle and isn't latching well baby may be a good candidate to continue some nipple feedings as needed, but mom will continue to latch at the breast first.   Mom to call for assist as needed.    Patient Name: Danielle [employer] Null [id number] Date: 11/27/2013 Reason for consult: Initial assessment   Maternal Data Has patient been taught Hand Expression?: Yes Does the patient have breastfeeding experience prior to this delivery?: No  Feeding Feeding Type: Bottle Fed - Formula Nipple Type: Slow - flow (Uncoordinated suck)  LATCH Score/Interventions Latch: Too sleepy or reluctant, no latch achieved, no sucking elicited. Intervention(s): Waking techniques;Teach feeding cues;Skin to skin Intervention(s): Assist with latch;Breast compression  Audible Swallowing: None  Type of Nipple: Everted at rest and after stimulation  Comfort (Breast/Nipple): Soft / non-tender     Hold (Positioning): Assistance needed to correctly position  infant at breast and maintain latch.  LATCH Score: 5  Lactation Tools Discussed/Used     Consult Status Consult Status: Follow-up Date: 11/28/13 Follow-up type: In-patient    Grayce Budden, Arvella Merles 11/27/2013, 5:30 PM

## 2013-11-28 MED ORDER — IBUPROFEN 600 MG PO TABS: 600.0000 mg | ORAL_TABLET | Freq: Four times a day (QID) | ORAL | Status: DC | PRN

## 2013-11-28 MED ORDER — SENNOSIDES-DOCUSATE SODIUM 8.6-50 MG PO TABS: 2.0000 | ORAL_TABLET | Freq: Every evening | ORAL | Status: DC | PRN

## 2013-11-28 MED ORDER — OXYCODONE-ACETAMINOPHEN 5-325 MG PO TABS: 1.0000 | ORAL_TABLET | ORAL | Status: DC | PRN

## 2013-11-28 NOTE — Discharge Summary (Signed)
Obstetric Discharge Summary Reason for Admission: onset of labor Prenatal Procedures: none Intrapartum Procedures: spontaneous vaginal delivery Postpartum Procedures: none Complications-Operative and Postpartum: 2nd degree perineal laceration Hemoglobin  Date Value Ref Range Status  11/27/2013 11.2* 12.0 - 15.0 g/dL Final     HCT  Date Value Ref Range Status  11/27/2013 32.6* 36.0 - 46.0 % Final    Physical Exam:  General: alert, cooperative and no distress Lochia: appropriate Uterine Fundus: firm perineum: healing well DVT Evaluation: No evidence of DVT seen on physical exam. Negative Homan's sign. No cords or calf tenderness.  Discharge Diagnoses: Term Pregnancy-delivered  Discharge Information: Date: 11/28/2013 Activity: pelvic rest Diet: routine Medications: PNV, Ibuprofen, Colace and Percocet Condition: stable Instructions: refer to practice specific booklet Discharge to: home Follow-up Information   Follow up with Almon Hercules., MD On 12/24/2013. (Postpartum check-up on 12/24/2013 at 11:15 a.m.)    Specialty:  Obstetrics and Gynecology   Contact information:   [address] [place] [address] [phone]       Newborn Data: Live born female  Birth Weight: 6 lb 12 oz (3062 g) APGAR: 4, 7  Home with mother.  Danielle Christensen Danielle Christensen 11/28/2013, 9:14 AM

## 2013-11-28 NOTE — Lactation Note (Signed)
This note was copied from the chart of Danielle Mehlani Blankenburg. Lactation Consultation Note RN reported baby not feeding well. Spoke w/mom about BF, mom stated it wasn't the baby it was her that she didn't have enough milk for the baby and that's why she was giving bottles. Explained baby's tummy size of cheery and didn't need much. Mom is able to express colostrum out but wasn't wanting to spoon feed, encouraged to put the baby to the breast and she wouldn't have to spoon feed. Discussed newborn behavior and how they are sleepy the first 24 hrs. And then they start being able to feed at the breast better. Mom had been giving formula in bottles and states he takes that pretty well and she may try BF again. Asked mom if she wanted to BF and stated she would still try. i offered to assist mom in feeding and mom stated that the baby was going to have a test soon and that she had all ready bottle fed. Encouraged mom to call [facility] for next feeding and I would be happy to assist her in a latch.  Patient Name: Danielle Christensen [id number] Date: 11/28/2013     Maternal Data    Feeding    LATCH Score/Interventions                      Lactation Tools Discussed/Used     Consult Status      Sharin Altidor, Diamond Nickel 11/28/2013, 5:30 AM
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
## 2013-11-28 NOTE — Lactation Note (Addendum)
This note was copied from the chart of Danielle Sania Noy. Lactation Consultation Note  Patient Name: Danielle Christensen Date: 11/28/2013 Reason for consult: Difficult latch;Follow-up assessment Baby 36 hours of life. Mom reports not seeing much colostrum, so she has been using a manual pump for extra stimulation. Mom states baby is not wanting to suck well either at mom's breast or the bottle nipple during supplementation. Assisted mom to massage and hand express drops of colostrum from both breasts. Mom offered baby the drops with her finger, but baby did not awaken. Undressed baby and assisted mom to latch baby to left breast in cross-cradle position. Baby still sleepy and would not wake to latch. Mom reports that she did try to supplement baby earlier with formula via bottle, but baby does not take the bottle well either. LC placed gloved finger in baby's mouth, baby has very weak suck. Enc mom to offer lots of STS. Also enc mom to offer the breast often. Enc mom to perform suck training with her own finger. Enc mom to call out for assistance with feeding as needed. Mom has supplementation guidelines. Enc mom to offer breast first, then supplement with formula according to supplementation guidelines. Enc mom to consider DEBP for breast stimulation and protection of milk supply, especially if baby continues to be sleepy at breast. Mom has hand pump at bedside and has been post-pumping. Discussed assessment and interventions with patient's [facility] RN Tora Duck and patient's pediatrician Dr. Lowry Bowl.   Maternal Data    Feeding Feeding Type: Breast Fed Length of feed: 0 min (Baby sleepy.)  LATCH Score/Interventions Latch: Too sleepy or reluctant, no latch achieved, no sucking elicited. Intervention(s): Skin to skin;Waking techniques Intervention(s): Adjust position;Assist with latch;Breast massage;Breast compression  Audible Swallowing: None  Type of Nipple: Everted at rest and  after stimulation  Comfort (Breast/Nipple): Soft / non-tender     Hold (Positioning): Assistance needed to correctly position infant at breast and maintain latch. Intervention(s): Breastfeeding basics reviewed  LATCH Score: 5  Lactation Tools Discussed/Used Tools: Pump Breast pump type: Manual   Consult Status Consult Status: PRN    Geralynn Ochs 11/28/2013, 9:42 AM

## 2013-11-28 NOTE — Progress Notes (Signed)
Post Partum Day 2 Subjective: no complaints, up ad lib, voiding, tolerating PO, + flatus and breast feeding  Objective: Blood pressure 96/55, pulse 72, temperature 97.8 F (36.6 C), temperature source Oral, resp. rate 16, height  (1.626 m), weight 65.772 kg (145 lb), last menstrual period 11/15/2012, SpO2 100.00%, unknown if currently breastfeeding.  Physical Exam:  General: alert, cooperative and no distress Lochia: appropriate Uterine Fundus: firm perineum: healing well DVT Evaluation: No evidence of DVT seen on physical exam. Negative Homan's sign. No cords or calf tenderness.   Recent Labs  11/26/13 1030 11/27/13 0600  HGB 13.2 11.2*  HCT 38.0 32.6*    Assessment/Plan: Discharge home, Breastfeeding and Contraception will discuss at post partum visit Circumcision at office   LOS: 2 days   Danielle Christensen Danielle Christensen 11/28/2013, 9:11 AM

## 2013-11-29 ENCOUNTER — Ambulatory Visit: Payer: Self-pay

## 2013-11-29 NOTE — Lactation Note (Signed)
This note was copied from the chart of Danielle Christensen. Lactation Consultation Note  Patient Name: Danielle Shammara Jarrett [id number] Date: 11/29/2013 Reason for consult: Follow-up assessment Baby 60 hours of life. Mom reports that the baby is eating much better and just "ate a big bottle." Mom states that her breasts are filling and are uncomfortable. Mom reports that the last time she put baby to breast was 0200. Mom states that her goal is to breastfeed baby exclusively. Enc mom to put baby to breast first at each feeding. Discussed the need to have baby emptying breast so that she does not become engorged. Assisted mom to attempt to latch baby to right breast in cross-cradle position. Reviewed the basics of breastfeeding. Mom return-demonstrated hand expression with colostrum dripping from both breast. Mom return-demonstrated how to use manual pump to remove EBM for comfort and to soften breast when needed in order to compress breast for deep latch with baby. Baby sleepy, would not open mouth to latch. Reviewed how to latch baby. Discussed hospital DEBP rentals, but mom states that her mother is going to pick up a rental DEBP today for her to have at home. Enc mom to attempt to latch baby at breast with cues and at least 8-12 times/24 hours, hand expressing to soften breast when needed, and if baby will not latch due to breast fullness, to use DEBP to soften breast. Mom states that she may pump and bottle-feed breast milk until baby more alert and able to nurse directly at breast. Baby is eating better with bottle now, but has not been nursing at breast. Reviewed engorgement prevention/treatment. Mom aware of OP/BFSG services. Mom enc to call [facility] phone line for assistance as needed after discharge. Referred mom to Baby and Me booklet for EBM storage guidelines and number of diapers to expect according to day of life.   Maternal Data    Feeding Feeding Type: Breast Fed Length of feed: 0 min  LATCH  Score/Interventions Latch: Too sleepy or reluctant, no latch achieved, no sucking elicited. Intervention(s): Skin to skin Intervention(s): Adjust position;Assist with latch;Breast massage;Breast compression  Audible Swallowing: None Intervention(s): Hand expression  Type of Nipple: Everted at rest and after stimulation  Comfort (Breast/Nipple): Soft / non-tender     Hold (Positioning): Assistance needed to correctly position infant at breast and maintain latch. Intervention(s): Breastfeeding basics reviewed;Support Pillows  LATCH Score: 5  Lactation Tools Discussed/Used Tools: Pump Breast pump type: Manual   Consult Status Consult Status: PRN    Geralynn Ochs 11/29/2013, 9:18 AM

## 2014-01-02 ENCOUNTER — Other Ambulatory Visit: Payer: Self-pay

## 2014-01-03 LAB — CYTOLOGY - PAP

## 2014-02-03 ENCOUNTER — Encounter (HOSPITAL_COMMUNITY): Payer: Self-pay

## 2015-02-09 LAB — OB RESULTS CONSOLE RPR: RPR: NONREACTIVE

## 2015-02-09 LAB — OB RESULTS CONSOLE RUBELLA ANTIBODY, IGM: RUBELLA: IMMUNE

## 2015-02-09 LAB — OB RESULTS CONSOLE ABO/RH: RH Type: POSITIVE

## 2015-02-09 LAB — OB RESULTS CONSOLE HEPATITIS B SURFACE ANTIGEN: HEP B S AG: NEGATIVE

## 2015-02-09 LAB — OB RESULTS CONSOLE ANTIBODY SCREEN: ANTIBODY SCREEN: NEGATIVE

## 2015-02-09 LAB — OB RESULTS CONSOLE HIV ANTIBODY (ROUTINE TESTING): HIV: NONREACTIVE

## 2015-02-09 LAB — OB RESULTS CONSOLE GC/CHLAMYDIA
Chlamydia: NEGATIVE
GC PROBE AMP, GENITAL: NEGATIVE

## 2015-04-05 NOTE — L&D Delivery Note (Addendum)
Operative Delivery Note Complete dilatation at 4.40 pm. Labored down from 0 station to +2 and started to push at 6.10 pm. OP position noted and maternal exhaustion was setting in, so vacuum assistance was discussed and patient agreed.   At 8:01 PM a viable and healthy female was delivered via Vaginal, Vacuum Investment banker, operational).  Presentation: vertex; Position: Right,, Occiput,, Posterior; Station: +3. Using Mushroom vacuum and over 2 contractions, 3 pushes each.   Verbal consent: obtained from patient.  Risks and benefits discussed in detail.  Risks include, but are not limited to the risks of anesthesia, bleeding, infection, damage to maternal tissues, fetal cephalhematoma.  There is also the risk of inability to effect vaginal delivery of the head, or shoulder dystocia that cannot be resolved by established maneuvers, leading to the need for emergency cesarean section.  APGAR: 9, 9; weight -pending .   Placenta status: Intact, Spontaneous.   Cord: 3 vessels with the following complications: None.  Cord pH: sent  Anesthesia: Epidural  Instruments: Mushroom vacuum  Episiotomy: None Lacerations: 1st degree;Perineal Suture Repair: 3.0 vicryl rapide Est. Blood Loss (mL):  70 cc  Mom to postpartum.  Baby to Couplet care / Skin to Skin.  Brian Zeitlin R 05/07/2015, 8:24 PM

## 2015-04-17 LAB — OB RESULTS CONSOLE GBS: GBS: POSITIVE

## 2015-04-22 ENCOUNTER — Inpatient Hospital Stay (HOSPITAL_COMMUNITY)
Admission: AD | Admit: 2015-04-22 | Discharge: 2015-04-22 | Disposition: A | Discharge status: Home / Self Care | Payer: Medicaid Other | Source: Ambulatory Visit | Attending: Obstetrics & Gynecology | Admitting: Obstetrics & Gynecology

## 2015-04-22 ENCOUNTER — Encounter (HOSPITAL_COMMUNITY): Payer: Self-pay | Admitting: *Deleted

## 2015-04-22 DIAGNOSIS — Z3493 Encounter for supervision of normal pregnancy, unspecified, third trimester: Secondary | ICD-10-CM | POA: Diagnosis present

## 2015-04-22 MED ORDER — PROMETHAZINE HCL 25 MG/ML IJ SOLN
12.5000 mg | Freq: Four times a day (QID) | INTRAMUSCULAR | Status: DC | PRN
  Administered 2015-04-22: 12.5 mg via INTRAMUSCULAR
  Filled 2015-04-22: qty 1

## 2015-04-22 MED ORDER — MORPHINE SULFATE (PF) 4 MG/ML IV SOLN
4.0000 mg | Freq: Once | INTRAVENOUS | Status: AC
  Administered 2015-04-22: 4 mg via INTRAMUSCULAR
  Filled 2015-04-22: qty 1

## 2015-04-22 NOTE — Discharge Instructions (Signed)
Third Trimester of Pregnancy The third trimester is from week 29 through week 42, months 7 through 9. This trimester is when your unborn baby (fetus) is growing very fast. At the end of the ninth month, the unborn baby is about 20 inches in length. It weighs about 6-10 pounds.  HOME CARE   Avoid all smoking, herbs, and alcohol. Avoid drugs not approved by your doctor.  Do not use any tobacco products, including cigarettes, chewing tobacco, and electronic cigarettes. If you need help quitting, ask your doctor. You may get counseling or other support to help you quit.  Only take medicine as told by your doctor. Some medicines are safe and some are not during pregnancy.  Exercise only as told by your doctor. Stop exercising if you start having cramps.  Eat regular, healthy meals.  Wear a good support bra if your breasts are tender.  Do not use hot tubs, steam rooms, or saunas.  Wear your seat belt when driving.  Avoid raw meat, uncooked cheese, and liter boxes and soil used by cats.  Take your prenatal vitamins.  Take 1500-2000 milligrams of calcium daily starting at the 20th week of pregnancy until you deliver your baby.  Try taking medicine that helps you poop (stool softener) as needed, and if your doctor approves. Eat more fiber by eating fresh fruit, vegetables, and whole grains. Drink enough fluids to keep your pee (urine) clear or pale yellow.  Take warm water baths (sitz baths) to soothe pain or discomfort caused by hemorrhoids. Use hemorrhoid cream if your doctor approves.  If you have puffy, bulging veins (varicose veins), wear support hose. Raise (elevate) your feet for 15 minutes, 3-4 times a day. Limit salt in your diet.  Avoid heavy lifting, wear low heels, and sit up straight.  Rest with your legs raised if you have leg cramps or low back pain.  Visit your dentist if you have not gone during your pregnancy. Use a soft toothbrush to brush your teeth. Be gentle when you  floss.  You can have sex (intercourse) unless your doctor tells you not to.  Do not travel far distances unless you must. Only do so with your doctor's approval.  Take prenatal classes.  Practice driving to the hospital.  Pack your hospital bag.  Prepare the baby's room.  Go to your doctor visits. GET HELP IF:  You are not sure if you are in labor or if your water has broken.  You are dizzy.  You have mild cramps or pressure in your lower belly (abdominal).  You have a nagging pain in your belly area.  You continue to feel sick to your stomach (nauseous), throw up (vomit), or have watery poop (diarrhea).  You have bad smelling fluid coming from your vagina.  You have pain with peeing (urination). GET HELP RIGHT AWAY IF:   You have a fever.  You are leaking fluid from your vagina.  You are spotting or bleeding from your vagina.  You have severe belly cramping or pain.  You lose or gain weight rapidly.  You have trouble catching your breath and have chest pain.  You notice sudden or extreme puffiness (swelling) of your face, hands, ankles, feet, or legs.  You have not felt the baby move in over an hour.  You have severe headaches that do not go away with medicine.  You have vision changes.   This information is not intended to replace advice given to you by your health care  provider. Make sure you discuss any questions you have with your health care provider. °  °Document Released: 06/15/2009 Document Revised: 04/11/2014 Document Reviewed: 05/22/2012 °Elsevier Interactive Patient Education ©2016 [employer] °Braxton Hicks Contractions °Contractions of the uterus can occur throughout pregnancy. Contractions are not always a sign that you are in labor.  °WHAT ARE BRAXTON HICKS CONTRACTIONS?  °Contractions that occur before labor are called Braxton Hicks contractions, or false labor. Toward the end of pregnancy (32-34 weeks), these contractions can develop more often  and may become more forceful. This is not true labor because these contractions do not result in opening (dilatation) and thinning of the cervix. They are sometimes difficult to tell apart from true labor because these contractions can be forceful and people have different pain tolerances. You should not feel embarrassed if you go to the hospital with false labor. Sometimes, the only way to tell if you are in true labor is for your health care provider to look for changes in the cervix. °If there are no prenatal problems or other health problems associated with the pregnancy, it is completely safe to be sent home with false labor and await the onset of true labor. °HOW CAN YOU TELL THE DIFFERENCE BETWEEN TRUE AND FALSE LABOR? °False Labor °· The contractions of false labor are usually shorter and not as hard as those of true labor.   °· The contractions are usually irregular.   °· The contractions are often felt in the front of the lower abdomen and in the groin.   °· The contractions may go away when you walk around or change positions while lying down.   °· The contractions get weaker and are shorter lasting as time goes on.   °· The contractions do not usually become progressively stronger, regular, and closer together as with true labor.   °True Labor °· Contractions in true labor last 30-70 seconds, become very regular, usually become more intense, and increase in frequency.   °· The contractions do not go away with walking.   °· The discomfort is usually felt in the top of the uterus and spreads to the lower abdomen and low back.   °· True labor can be determined by your health care provider with an exam. This will show that the cervix is dilating and getting thinner.   °WHAT TO REMEMBER °· Keep up with your usual exercises and follow other instructions given by your health care provider.   °· Take medicines as directed by your health care provider.   °· Keep your regular prenatal appointments.   °· Eat and  drink lightly if you think you are going into labor.   °· If Braxton Hicks contractions are making you uncomfortable:   °· Change your position from lying down or resting to walking, or from walking to resting.   °· Sit and rest in a tub of warm water.   °· Drink 2-3 glasses of water. Dehydration may cause these contractions.   °· Do slow and deep breathing several times an hour.   °WHEN SHOULD I SEEK IMMEDIATE MEDICAL CARE? °Seek immediate medical care if: °· Your contractions become stronger, more regular, and closer together.   °· You have fluid leaking or gushing from your vagina.   °· You have a fever.   °· You pass blood-tinged mucus.   °· You have vaginal bleeding.   °· You have continuous abdominal pain.   °· You have low back pain that you never had before.   °· You feel your baby's head pushing down and causing pelvic pressure.   °· Your baby is not moving as much as it   used to.    This information is not intended to replace advice given to you by your health care provider. Make sure you discuss any questions you have with your health care provider.   Document Released: 03/21/2005 Document Revised: 03/26/2013 Document Reviewed: 12/31/2012 Elsevier Interactive Patient Education 2016 [place]. Fetal Movement Counts Patient Name: __________________________________________________ Patient Due Date: ____________________ Performing a fetal movement count is highly recommended in high-risk pregnancies, but it is good for every pregnant woman to do. Your health care provider may ask you to start counting fetal movements at 28 weeks of the pregnancy. Fetal movements often increase:  After eating a full meal.  After physical activity.  After eating or drinking something sweet or cold.  At rest. Pay attention to when you feel the baby is most active. This will help you notice a pattern of your baby's sleep and wake cycles and what factors contribute to an increase in fetal movement. It is  important to perform a fetal movement count at the same time each day when your baby is normally most active.  HOW TO COUNT FETAL MOVEMENTS  Find a quiet and comfortable area to sit or lie down on your left side. Lying on your left side provides the best blood and oxygen circulation to your baby.  Write down the day and time on a sheet of paper or in a journal.  Start counting kicks, flutters, swishes, rolls, or jabs in a 2-hour period. You should feel at least 10 movements within 2 hours.  If you do not feel 10 movements in 2 hours, wait 2-3 hours and count again. Look for a change in the pattern or not enough counts in 2 hours. SEEK MEDICAL CARE IF:  You feel less than 10 counts in 2 hours, tried twice.  There is no movement in over an hour.  The pattern is changing or taking longer each day to reach 10 counts in 2 hours.  You feel the baby is not moving as he or she usually does. Date: ____________ Movements: ____________ Start time: ____________ Danielle Christensen time: ____________  Date: ____________ Movements: ____________ Start time: ____________ Danielle Christensen time: ____________ Date: ____________ Movements: ____________ Start time: ____________ Danielle Christensen time: ____________ Date: ____________ Movements: ____________ Start time: ____________ Danielle Christensen time: ____________ Date: ____________ Movements: ____________ Start time: ____________ Danielle Christensen time: ____________ Date: ____________ Movements: ____________ Start time: ____________ Danielle Christensen time: ____________ Date: ____________ Movements: ____________ Start time: ____________ Danielle Christensen time: ____________ Date: ____________ Movements: ____________ Start time: ____________ Danielle Christensen time: ____________  Date: ____________ Movements: ____________ Start time: ____________ Danielle Christensen time: ____________ Date: ____________ Movements: ____________ Start time: ____________ Danielle Christensen time: ____________ Date: ____________ Movements: ____________ Start time: ____________ Danielle Christensen time:  ____________ Date: ____________ Movements: ____________ Start time: ____________ Danielle Christensen time: ____________ Date: ____________ Movements: ____________ Start time: ____________ Danielle Christensen time: ____________ Date: ____________ Movements: ____________ Start time: ____________ Danielle Christensen time: ____________ Date: ____________ Movements: ____________ Start time: ____________ Danielle Christensen time: ____________  Date: ____________ Movements: ____________ Start time: ____________ Danielle Christensen time: ____________ Date: ____________ Movements: ____________ Start time: ____________ Danielle Christensen time: ____________ Date: ____________ Movements: ____________ Start time: ____________ Danielle Christensen time: ____________ Date: ____________ Movements: ____________ Start time: ____________ Danielle Christensen time: ____________ Date: ____________ Movements: ____________ Start time: ____________ Danielle Christensen time: ____________ Date: ____________ Movements: ____________ Start time: ____________ Danielle Christensen time: ____________ Date: ____________ Movements: ____________ Start time: ____________ Danielle Christensen time: ____________  Date: ____________ Movements: ____________ Start time: ____________ Danielle Christensen time: ____________ Date: ____________ Movements: ____________ Start time: ____________ Danielle Christensen time: ____________ Date: ____________ Movements: ____________ Start time: ____________  Finish time: ____________ Date: ____________ Movements: ____________ Start time: ____________ Danielle Christensen time: ____________ Date: ____________ Movements: ____________ Start time: ____________ Danielle Christensen time: ____________ Date: ____________ Movements: ____________ Start time: ____________ Danielle Christensen time: ____________ Date: ____________ Movements: ____________ Start time: ____________ Danielle Christensen time: ____________  Date: ____________ Movements: ____________ Start time: ____________ Danielle Christensen time: ____________ Date: ____________ Movements: ____________ Start time: ____________ Danielle Christensen time: ____________ Date: ____________ Movements:  ____________ Start time: ____________ Danielle Christensen time: ____________ Date: ____________ Movements: ____________ Start time: ____________ Danielle Christensen time: ____________ Date: ____________ Movements: ____________ Start time: ____________ Danielle Christensen time: ____________ Date: ____________ Movements: ____________ Start time: ____________ Danielle Christensen time: ____________ Date: ____________ Movements: ____________ Start time: ____________ Danielle Christensen time: ____________  Date: ____________ Movements: ____________ Start time: ____________ Danielle Christensen time: ____________ Date: ____________ Movements: ____________ Start time: ____________ Danielle Christensen time: ____________ Date: ____________ Movements: ____________ Start time: ____________ Danielle Christensen time: ____________ Date: ____________ Movements: ____________ Start time: ____________ Danielle Christensen time: ____________ Date: ____________ Movements: ____________ Start time: ____________ Danielle Christensen time: ____________ Date: ____________ Movements: ____________ Start time: ____________ Danielle Christensen time: ____________ Date: ____________ Movements: ____________ Start time: ____________ Danielle Christensen time: ____________  Date: ____________ Movements: ____________ Start time: ____________ Danielle Christensen time: ____________ Date: ____________ Movements: ____________ Start time: ____________ Danielle Christensen time: ____________ Date: ____________ Movements: ____________ Start time: ____________ Danielle Christensen time: ____________ Date: ____________ Movements: ____________ Start time: ____________ Danielle Christensen time: ____________ Date: ____________ Movements: ____________ Start time: ____________ Danielle Christensen time: ____________ Date: ____________ Movements: ____________ Start time: ____________ Danielle Christensen time: ____________ Date: ____________ Movements: ____________ Start time: ____________ Danielle Christensen time: ____________  Date: ____________ Movements: ____________ Start time: ____________ Danielle Christensen time: ____________ Date: ____________ Movements: ____________ Start time: ____________ Danielle Christensen  time: ____________ Date: ____________ Movements: ____________ Start time: ____________ Danielle Christensen time: ____________ Date: ____________ Movements: ____________ Start time: ____________ Danielle Christensen time: ____________ Date: ____________ Movements: ____________ Start time: ____________ Danielle Christensen time: ____________ Date: ____________ Movements: ____________ Start time: ____________ Danielle Christensen time: ____________   This information is not intended to replace advice given to you by your health care provider. Make sure you discuss any questions you have with your health care provider.   Document Released: 04/20/2006 Document Revised: 04/11/2014 Document Reviewed: 01/16/2012 Elsevier Interactive Patient Education [employer].

## 2015-04-29 ENCOUNTER — Other Ambulatory Visit: Payer: Self-pay | Admitting: Obstetrics & Gynecology

## 2015-05-04 ENCOUNTER — Telehealth (HOSPITAL_COMMUNITY): Payer: Self-pay | Admitting: *Deleted

## 2015-05-04 ENCOUNTER — Encounter (HOSPITAL_COMMUNITY): Payer: Self-pay | Admitting: *Deleted

## 2015-05-04 NOTE — Telephone Encounter (Signed)
Preadmission screen  

## 2015-05-07 ENCOUNTER — Inpatient Hospital Stay (HOSPITAL_COMMUNITY): Payer: 59 | Admitting: Anesthesiology

## 2015-05-07 ENCOUNTER — Encounter (HOSPITAL_COMMUNITY): Payer: Self-pay

## 2015-05-07 ENCOUNTER — Inpatient Hospital Stay (HOSPITAL_COMMUNITY)
Admission: RE | Admit: 2015-05-07 | Discharge: 2015-05-09 | DRG: 775 | Disposition: A | Discharge status: Home / Self Care | Payer: 59 | Source: Ambulatory Visit | Attending: Obstetrics & Gynecology | Admitting: Obstetrics & Gynecology

## 2015-05-07 VITALS — BP 106/59 | HR 66 | Temp 97.7°F | Resp 18 | Ht 64.0 in | Wt 171.0 lb

## 2015-05-07 DIAGNOSIS — O99824 Streptococcus B carrier state complicating childbirth: Secondary | ICD-10-CM | POA: Diagnosis present

## 2015-05-07 DIAGNOSIS — O9952 Diseases of the respiratory system complicating childbirth: Secondary | ICD-10-CM | POA: Diagnosis present

## 2015-05-07 DIAGNOSIS — Z3A39 39 weeks gestation of pregnancy: Secondary | ICD-10-CM

## 2015-05-07 DIAGNOSIS — Z8759 Personal history of other complications of pregnancy, childbirth and the puerperium: Secondary | ICD-10-CM

## 2015-05-07 DIAGNOSIS — Z3490 Encounter for supervision of normal pregnancy, unspecified, unspecified trimester: Secondary | ICD-10-CM

## 2015-05-07 DIAGNOSIS — J45909 Unspecified asthma, uncomplicated: Secondary | ICD-10-CM | POA: Diagnosis present

## 2015-05-07 LAB — CBC
HCT: 32.9 % — ABNORMAL LOW (ref 36.0–46.0)
Hemoglobin: 10.8 g/dL — ABNORMAL LOW (ref 12.0–15.0)
MCH: 29.6 pg (ref 26.0–34.0)
MCHC: 32.8 g/dL (ref 30.0–36.0)
MCV: 90.1 fL (ref 78.0–100.0)
PLATELETS: 167 10*3/uL (ref 150–400)
RBC: 3.65 MIL/uL — ABNORMAL LOW (ref 3.87–5.11)
RDW: 14 % (ref 11.5–15.5)
WBC: 9.4 10*3/uL (ref 4.0–10.5)

## 2015-05-07 LAB — TYPE AND SCREEN
ABO/RH(D): A POS
Antibody Screen: NEGATIVE

## 2015-05-07 MED ORDER — TERBUTALINE SULFATE 1 MG/ML IJ SOLN
0.2500 mg | Freq: Once | INTRAMUSCULAR | Status: DC | PRN
  Filled 2015-05-07: qty 1

## 2015-05-07 MED ORDER — ACETAMINOPHEN 325 MG PO TABS: 650.0000 mg | ORAL_TABLET | ORAL | Status: DC | PRN

## 2015-05-07 MED ORDER — ACETAMINOPHEN 325 MG PO TABS
650.0000 mg | ORAL_TABLET | ORAL | Status: DC | PRN
  Administered 2015-05-08: 650 mg via ORAL
  Filled 2015-05-07: qty 2

## 2015-05-07 MED ORDER — LACTATED RINGERS IV SOLN
500.0000 mL | INTRAVENOUS | Status: DC | PRN
  Administered 2015-05-07: 500 mL via INTRAVENOUS

## 2015-05-07 MED ORDER — DIPHENHYDRAMINE HCL 25 MG PO CAPS: 25.0000 mg | ORAL_CAPSULE | Freq: Four times a day (QID) | ORAL | Status: DC | PRN

## 2015-05-07 MED ORDER — CITRIC ACID-SODIUM CITRATE 334-500 MG/5ML PO SOLN: 30.0000 mL | ORAL | Status: DC | PRN

## 2015-05-07 MED ORDER — DIPHENHYDRAMINE HCL 50 MG/ML IJ SOLN: 12.5000 mg | INTRAMUSCULAR | Status: DC | PRN

## 2015-05-07 MED ORDER — LIDOCAINE HCL (PF) 1 % IJ SOLN
30.0000 mL | INTRAMUSCULAR | Status: DC | PRN
  Filled 2015-05-07: qty 30

## 2015-05-07 MED ORDER — FENTANYL 2.5 MCG/ML BUPIVACAINE 1/10 % EPIDURAL INFUSION (WH - ANES)
14.0000 mL/h | INTRAMUSCULAR | Status: DC | PRN
  Administered 2015-05-07 (×2): 14 mL/h via EPIDURAL
  Filled 2015-05-07: qty 125

## 2015-05-07 MED ORDER — DEXTROSE 5 % IV SOLN
5.0000 10*6.[IU] | Freq: Once | INTRAVENOUS | Status: AC
  Administered 2015-05-07: 5 10*6.[IU] via INTRAVENOUS
  Filled 2015-05-07: qty 5

## 2015-05-07 MED ORDER — ONDANSETRON HCL 4 MG/2ML IJ SOLN: 4.0000 mg | Freq: Four times a day (QID) | INTRAMUSCULAR | Status: DC | PRN

## 2015-05-07 MED ORDER — OXYCODONE-ACETAMINOPHEN 5-325 MG PO TABS: 1.0000 | ORAL_TABLET | ORAL | Status: DC | PRN

## 2015-05-07 MED ORDER — SENNOSIDES-DOCUSATE SODIUM 8.6-50 MG PO TABS
2.0000 | ORAL_TABLET | Freq: Every evening | ORAL | Status: DC | PRN
Start: 2015-05-07 — End: 2015-05-09

## 2015-05-07 MED ORDER — PENICILLIN G POTASSIUM 5000000 UNITS IJ SOLR
2.5000 10*6.[IU] | INTRAMUSCULAR | Status: DC
  Administered 2015-05-07 (×2): 2.5 10*6.[IU] via INTRAVENOUS
  Filled 2015-05-07 (×5): qty 2.5

## 2015-05-07 MED ORDER — SENNOSIDES-DOCUSATE SODIUM 8.6-50 MG PO TABS
2.0000 | ORAL_TABLET | ORAL | Status: DC
  Administered 2015-05-07 – 2015-05-09 (×2): 2 via ORAL
  Filled 2015-05-07 (×2): qty 2

## 2015-05-07 MED ORDER — FLEET ENEMA 7-19 GM/118ML RE ENEM: 1.0000 | ENEMA | RECTAL | Status: DC | PRN

## 2015-05-07 MED ORDER — OXYTOCIN BOLUS FROM INFUSION
500.0000 mL | INTRAVENOUS | Status: DC
  Administered 2015-05-07: 500 mL via INTRAVENOUS

## 2015-05-07 MED ORDER — ONDANSETRON HCL 4 MG PO TABS: 4.0000 mg | ORAL_TABLET | ORAL | Status: DC | PRN

## 2015-05-07 MED ORDER — EPHEDRINE 5 MG/ML INJ
10.0000 mg | INTRAVENOUS | Status: DC | PRN
  Filled 2015-05-07: qty 2

## 2015-05-07 MED ORDER — IBUPROFEN 600 MG PO TABS
600.0000 mg | ORAL_TABLET | Freq: Four times a day (QID) | ORAL | Status: DC
  Administered 2015-05-07 – 2015-05-08 (×2): 600 mg via ORAL
  Filled 2015-05-07 (×2): qty 1

## 2015-05-07 MED ORDER — PRENATAL MULTIVITAMIN CH
1.0000 | ORAL_TABLET | Freq: Every day | ORAL | Status: DC
  Administered 2015-05-08 – 2015-05-09 (×2): 1 via ORAL
  Filled 2015-05-07 (×2): qty 1

## 2015-05-07 MED ORDER — OXYTOCIN 10 UNIT/ML IJ SOLN: 2.5000 [IU]/h | INTRAMUSCULAR | Status: DC

## 2015-05-07 MED ORDER — LIDOCAINE HCL (PF) 1 % IJ SOLN
INTRAMUSCULAR | Status: DC | PRN
  Administered 2015-05-07: 3 mL via EPIDURAL
  Administered 2015-05-07: 6 mL via EPIDURAL

## 2015-05-07 MED ORDER — WITCH HAZEL-GLYCERIN EX PADS: 1.0000 "application " | MEDICATED_PAD | CUTANEOUS | Status: DC | PRN

## 2015-05-07 MED ORDER — BENZOCAINE-MENTHOL 20-0.5 % EX AERO
1.0000 "application " | INHALATION_SPRAY | CUTANEOUS | Status: DC | PRN
  Administered 2015-05-08: 1 via TOPICAL
  Filled 2015-05-07: qty 56

## 2015-05-07 MED ORDER — LANOLIN HYDROUS EX OINT: TOPICAL_OINTMENT | CUTANEOUS | Status: DC | PRN

## 2015-05-07 MED ORDER — OXYTOCIN 10 UNIT/ML IJ SOLN
1.0000 m[IU]/min | INTRAVENOUS | Status: DC
  Administered 2015-05-07: 2 m[IU]/min via INTRAVENOUS
  Filled 2015-05-07: qty 4

## 2015-05-07 MED ORDER — DIBUCAINE 1 % RE OINT: 1.0000 "application " | TOPICAL_OINTMENT | RECTAL | Status: DC | PRN

## 2015-05-07 MED ORDER — LACTATED RINGERS IV SOLN
INTRAVENOUS | Status: DC
  Administered 2015-05-07 (×2): via INTRAVENOUS

## 2015-05-07 MED ORDER — OXYCODONE-ACETAMINOPHEN 5-325 MG PO TABS: 2.0000 | ORAL_TABLET | ORAL | Status: DC | PRN

## 2015-05-07 MED ORDER — ONDANSETRON HCL 4 MG/2ML IJ SOLN
4.0000 mg | INTRAMUSCULAR | Status: DC | PRN
Start: 2015-05-07 — End: 2015-05-08

## 2015-05-07 MED ORDER — PHENYLEPHRINE 40 MCG/ML (10ML) SYRINGE FOR IV PUSH (FOR BLOOD PRESSURE SUPPORT)
80.0000 ug | PREFILLED_SYRINGE | INTRAVENOUS | Status: DC | PRN
  Filled 2015-05-07: qty 20
  Filled 2015-05-07: qty 2

## 2015-05-07 MED ORDER — SIMETHICONE 80 MG PO CHEW: 80.0000 mg | CHEWABLE_TABLET | ORAL | Status: DC | PRN

## 2015-05-07 MED ORDER — ZOLPIDEM TARTRATE 5 MG PO TABS: 5.0000 mg | ORAL_TABLET | Freq: Every evening | ORAL | Status: DC | PRN

## 2015-05-07 MED ORDER — TETANUS-DIPHTH-ACELL PERTUSSIS 5-2.5-18.5 LF-MCG/0.5 IM SUSP: 0.5000 mL | Freq: Once | INTRAMUSCULAR | Status: DC

## 2015-05-07 NOTE — Anesthesia Procedure Notes (Signed)
Epidural Patient location during procedure: OB Start time: 05/07/2015 2:23 PM End time: 05/07/2015 2:37 PM  Staffing Anesthesiologist: Jairo Ben Performed by: anesthesiologist   Preanesthetic Checklist Completed: patient identified, surgical consent, pre-op evaluation, timeout performed, IV checked, risks and benefits discussed and monitors and equipment checked  Epidural Patient position: sitting Prep: site prepped and draped and DuraPrep Patient monitoring: heart rate, continuous pulse ox and blood pressure Approach: midline Location: L3-L4 Injection technique: LOR air  Needle:  Needle type: Tuohy  Needle gauge: 17 G Needle length: 9 cm Needle insertion depth: 6 cm Catheter size: 19 Gauge Catheter at skin depth: 11 cm Test dose: negative  Additional Notes LOR 6cm Pt identified in Labor room.  Monitors applied. Working IV access confirmed. Sterile prep, drape lumbar spine.  1% lido local L 3,4.  #17 ga Touhy LOR air at 6 cm, cath in easily.  1% lido test negative.  Patient asymptomatic, VSS, no heme aspirated, tolerated well.  Sandford Craze, MD Reason for block:procedure for pain

## 2015-05-07 NOTE — Anesthesia Preprocedure Evaluation (Addendum)
Anesthesia Evaluation  Patient identified by MRN, date of birth, ID band Patient awake    Reviewed: Allergy & Precautions, NPO status , Patient's Chart, lab work & pertinent test results  History of Anesthesia Complications Negative for: history of anesthetic complications  Airway Mallampati: I  TM Distance: >3 FB Neck ROM: Full    Dental  (+) Teeth Intact, Dental Advisory Given   Pulmonary asthma ,    breath sounds clear to auscultation       Cardiovascular negative cardio ROS   Rhythm:Regular Rate:Normal     Neuro/Psych negative neurological ROS     GI/Hepatic negative GI ROS, Neg liver ROS,   Endo/Other  negative endocrine ROS  Renal/GU negative Renal ROS     Musculoskeletal   Abdominal   Peds  Hematology Hb 10.5, plt 167   Anesthesia Other Findings   Reproductive/Obstetrics (+) Pregnancy                            Anesthesia Physical Anesthesia Plan  ASA: II  Anesthesia Plan: Epidural   Post-op Pain Management:    Induction:   Airway Management Planned: Natural Airway  Additional Equipment:   Intra-op Plan:   Post-operative Plan:   Informed Consent: I have reviewed the patients History and Physical, chart, labs and discussed the procedure including the risks, benefits and alternatives for the proposed anesthesia with the patient or authorized representative who has indicated his/her understanding and acceptance.   Dental advisory given  Plan Discussed with:   Anesthesia Plan Comments: (Risks/benefits of LEA discussed with patient, who understands, accepts, asks for epidural)        Anesthesia Quick Evaluation

## 2015-05-07 NOTE — Progress Notes (Signed)
Patient ID: Danielle Christensen, female   DOB: [birth year], 25 y.o.   MRN: [id number] Subjective: Doing well, pain well controlled with Epidural, denies pelvic pressure   Objective: BP 99/64 mmHg  Pulse 85  Temp(Src) 97.9 F (36.6 C) (Oral)  Resp 18  Ht  (1.626 m)  Wt 171 lb (77.565 kg)  BMI 29.34 kg/m2  SpO2 100%   FHT:  FHR: 125 bpm, variability: moderate,  accelerations:  Present,  decelerations:  Present occ variable decels UC:   regular, every 2 minutes, pitocin down to 4 from 10 SVE:   Dilation: 10 Effacement (%): 100 Station: +2 Exam by:: S NIx RN   Assessment / Plan: Induction of labor due to term with favorable cervix,  progressing well on pitocin  Fetal Wellbeing:  Category I Pain Control:  Epidural  Anticipated MOD:  NSVD  Edis Huish R 05/07/2015, 5:35 PM

## 2015-05-07 NOTE — H&P (Signed)
Danielle Christensen is a 25 y.o. female presenting for labor induction. 39.4 wks. No complaints. Late PNCare from 27 wks. Late Anatomy sono. No illicit drug hx. Prodromal labor 3 wks  Prior VAVD, 6'12", OP, needed episiotomy  History OB History    Gravida Para Term Preterm AB TAB SAB Ectopic Multiple Living   0 1 0 1 0 0 1     Past Medical History  Diagnosis Date  . Anemia   . Asthma   . Infection     UTI   Past Surgical History  Procedure Laterality Date  . Knee surgery    . Knee arthroscopy     Family History: family history includes Cancer in her maternal grandmother. There is no history of Hearing loss. Social History:  reports that she has never smoked. She has never used smokeless tobacco. She reports that she does not drink alcohol or use illicit drugs.   Prenatal Transfer Tool  Maternal Diabetes: No Genetic Screening: Declined late  Maternal Ultrasounds/Referrals: Normal Fetal Ultrasounds or other Referrals:  None Maternal Substance Abuse:  No Significant Maternal Medications:  None Significant Maternal Lab Results:  Lab values include: Group B Strep positive Other Comments:  None  ROS neg   Dilation: 3.5 Effacement (%): 70 Station: -2 Exam by:: S Nix RN Blood pressure 114/61, pulse 94, temperature 97.9 F (36.6 C), temperature source Oral, resp. rate 18, height  (1.626 m), weight 171 lb (77.565 kg), unknown if currently breastfeeding. Exam Physical Exam   A&O x 3, no acute distress. Pleasant HEENT neg, no thyromegaly Lungs CTA bilat CV RRR, S1S2 normal Abdo soft, non tender, non acute Extr no edema/ tenderness Pelvic above FHT  140/ + accels/ no decels/ mod variab Toco q 3 min  Prenatal labs: ABO, Rh: --/--/A POS (02/02 1610) Antibody: NEG (02/02 0755) Rubella: Immune (11/07 0000) RPR: Nonreactive (11/07 0000)  HBsAg: Negative (11/07 0000)  HIV: Non-reactive (11/07 0000)  GBS: Positive (01/13 0000)   Assessment/Plan: IOL 39.4 wks  for prodromal labor and favorable cervix, GBS(+). On PCN. Pitocin IOL. Anticipate SVD  Tearra Ouk R 05/07/2015, 12:55 PM

## 2015-05-08 LAB — CBC
HCT: 32.3 % — ABNORMAL LOW (ref 36.0–46.0)
HEMOGLOBIN: 10.6 g/dL — AB (ref 12.0–15.0)
MCH: 29.5 pg (ref 26.0–34.0)
MCHC: 32.8 g/dL (ref 30.0–36.0)
MCV: 90 fL (ref 78.0–100.0)
Platelets: 158 10*3/uL (ref 150–400)
RBC: 3.59 MIL/uL — ABNORMAL LOW (ref 3.87–5.11)
RDW: 14 % (ref 11.5–15.5)
WBC: 14.4 10*3/uL — ABNORMAL HIGH (ref 4.0–10.5)

## 2015-05-08 LAB — RPR: RPR: NONREACTIVE

## 2015-05-08 MED ORDER — OXYCODONE-ACETAMINOPHEN 5-325 MG PO TABS: 1.0000 | ORAL_TABLET | ORAL | Status: DC | PRN

## 2015-05-08 MED ORDER — TRAMADOL HCL 50 MG PO TABS: 50.0000 mg | ORAL_TABLET | Freq: Four times a day (QID) | ORAL | Status: DC | PRN

## 2015-05-08 MED ORDER — IBUPROFEN 800 MG PO TABS
800.0000 mg | ORAL_TABLET | Freq: Three times a day (TID) | ORAL | Status: DC
  Administered 2015-05-08 – 2015-05-09 (×3): 800 mg via ORAL
  Filled 2015-05-08 (×4): qty 1

## 2015-05-08 NOTE — Progress Notes (Signed)
PPD 1 VAVD  S:  Reports feeling tired and sleepy             Tolerating po/ No nausea or vomiting             Bleeding is light             Pain controlled with Motrin and percocet             Up ad lib / ambulatory / voiding QS  Newborn breast & Bottle feeding  / Circumcision planned O:               VS: BP 108/49 mmHg  Pulse 92  Temp(Src) 98.2 F (36.8 C) (Oral)  Resp 18  Ht  (1.626 m)  Wt 77.565 kg (171 lb)  BMI 29.34 kg/m2  SpO2 100%  Breastfeeding? Unknown   LABS:              Recent Labs  05/07/15 0755 05/08/15 0618  WBC 9.4 14.4*  HGB 10.8* 10.6*  PLT 167 158               Blood type: --/--/A POS (02/02 0755)  Rubella: Immune (11/07 0000)                         Physical Exam:             Alert and oriented X3  Abdomen: soft, non-tender, non-distended              Fundus: firm, non-tender, Ueven  Perineum: ice pack in place  Lochia: light  Extremities: no edema, no calf pain or tenderness    A: PPD # 1 VAVD   Doing well - stable status  P: Routine post partum orders  Dc tomorrow Marlinda Mike CNM, MSN, [facility] 05/08/2015, 9:21 AM

## 2015-05-08 NOTE — Anesthesia Postprocedure Evaluation (Signed)
Anesthesia Post Note  Patient: Danielle Christensen  Procedure(s) Performed: * No procedures listed *  Patient location during evaluation: Mother Baby Anesthesia Type: Epidural Level of consciousness: awake and alert Pain management: pain level controlled Vital Signs Assessment: post-procedure vital signs reviewed and stable Respiratory status: spontaneous breathing Cardiovascular status: stable Postop Assessment: no signs of nausea or vomiting, adequate PO intake, no headache and no backache Anesthetic complications: no    Last Vitals:  Filed Vitals:   05/08/15 0345 05/08/15 0530  BP: 113/56 108/49  Pulse: 93 92  Temp: 36.8 C 36.8 C  Resp: 20 18    Last Pain:  Filed Vitals:   05/08/15 0537  PainSc: 3                  Larraine Argo Hristova

## 2015-05-08 NOTE — Lactation Note (Addendum)
This note was copied from the chart of Boy Zanita Millman. Lactation Consultation Note  Patient Name: Boy Suzette Flagler ZOXWR'U Date: 05/08/2015 Reason for consult: Initial assessment  With this mom of a term baby, now 52 hours old. Mom has decided to pump and bottle feed, and also feed formula. At this time she has been hand expressing and cup or spoon feeding EBM/formula. Mom is using a hand pump, and has been instructed in it's use. Mom stated she has a DEP at home, and it is a Lansinoh, and a family member is bringingit in to her to use today. Mom does not want a Medela DEP set up for her. I gave mom a volufeed and nipples, for her to feed the baby. Mom very receptive to teaching, and knows to call for questions/conerns.    Maternal Data Formula Feeding for Exclusion: Yes Reason for exclusion: Mother's choice to formula and breast feed on admission Has patient been taught Hand Expression?: No (mom already knowlegeable in hand expression) Does the patient have breastfeeding experience prior to this delivery?: Yes  Feeding    LATCH Score/Interventions                      Lactation Tools Discussed/Used Pump Review: Setup, frequency, and cleaning   Consult Status Consult Status: Follow-up Date: 05/09/15 Follow-up type: In-patient    Alfred Levins 05/08/2015, 10:12 AM

## 2015-05-09 MED ORDER — IBUPROFEN 800 MG PO TABS: 800.0000 mg | ORAL_TABLET | Freq: Three times a day (TID) | ORAL | Status: DC

## 2015-05-09 NOTE — Discharge Summary (Signed)
Obstetric Discharge Summary Reason for Admission: induction of labor and 39.4 weeks Prenatal Course: Late PNCare from 27 wks, hx of VAVD  Intrapartum Procedures: vacuum and AROM-MSF, epidural, Pitocin Postpartum Procedures: none Complications-Operative and Postpartum: 1st degree perineal laceration HEMOGLOBIN  Date Value Ref Range Status  05/08/2015 10.6* 12.0 - 15.0 g/dL Final   HCT  Date Value Ref Range Status  05/08/2015 32.3* 36.0 - 46.0 % Final    Physical Exam:  General: alert, cooperative and no distress Lochia: appropriate Uterine Fundus: firm Incision: healing well, no significant drainage, no dehiscence, no significant erythema DVT Evaluation: No evidence of DVT seen on physical exam. Negative Homan's sign. No cords or calf tenderness. No significant calf/ankle edema.  Discharge Diagnoses: Term Pregnancy-delivered  Discharge Information: Date: 05/09/2015 Activity: pelvic rest Diet: routine Medications: PNV and Ibuprofen Condition: stable Instructions: refer to practice specific booklet Discharge to: home Follow-up Information    Follow up with MODY,VAISHALI R, MD. Schedule an appointment as soon as possible for a visit in 6 weeks.   Specialty:  Obstetrics and Gynecology   Contact information:   [address] [place] [address] [phone]       Newborn Data: Live born female on 05/07/15 Birth Weight: 7 lb 14 oz (3572 g) APGAR: 9, 9  Home with mother.  Sarajean Dessert, N 05/09/2015, 11:38 AM

## 2015-05-09 NOTE — Lactation Note (Signed)
This note was copied from the chart of Danielle Amarii Amy. Lactation Consultation Note: Mother complaints of pain with latch and entire feeding. She states that she has been using the left breast due to no milk from the right. Assist mother with hand expression and observed large flow of colostrum. Mother assist with latching infant on the right breast in football hold. Observed a few audible swallows for 10 mins. Lots of teaching with mother. Mother advised to post pump to protect milk supply . Mother has been supplementing with formula. Advised to limit use of formula and pacifiers. Mother advised in treatment of severe engorgement. Instruct mother that infant needs 8-12 feedings in 24 hours. Mother is not active with WIC. She states she has a Lansinoh double electric pump at home. Mother informed of available LC services as needed.   Patient Name: Danielle Christensen ZOXWR'U Date: 05/09/2015 Reason for consult: Follow-up assessment   Maternal Data    Feeding Feeding Type: Breast Fed Nipple Type: Slow - flow Length of feed: 10 min  LATCH Score/Interventions Latch: Grasps breast easily, tongue down, lips flanged, rhythmical sucking.  Audible Swallowing: A few with stimulation Intervention(s): Hand expression (observed large flow of colostrum)  Type of Nipple: Everted at rest and after stimulation  Comfort (Breast/Nipple): Filling, red/small blisters or bruises, mild/mod discomfort  Problem noted: Filling;Mild/Moderate discomfort Interventions (Filling): Massage Interventions (Mild/moderate discomfort): Post-pump;Comfort gels  Hold (Positioning): Assistance needed to correctly position infant at breast and maintain latch. Intervention(s): Support Pillows;Position options  LATCH Score: 7  Lactation Tools Discussed/Used     Consult Status Consult Status: Complete    Michel Bickers 05/09/2015, 11:07 AM

## 2015-05-09 NOTE — Progress Notes (Signed)
PPD #2- SVD  Subjective:   Reports feeling well, some back pain Tolerating po/ No nausea or vomiting Bleeding is light Pain controlled with Motrin Up ad lib / ambulatory / voiding without problems Newborn: breastfeeding  / Circumcision: planning outpt  Objective:   VS: VS:  Filed Vitals:   05/08/15 0345 05/08/15 0530 05/08/15 1808 05/09/15 0608  BP: 113/56 108/49 109/56 106/59  Pulse: 93 92 67 66  Temp: 98.2 F (36.8 C) 98.2 F (36.8 C) 98.3 F (36.8 C) 97.7 F (36.5 C)  TempSrc: Oral  Axillary Oral  Resp: Height:      Weight:      SpO2:    99%    LABS:  Recent Labs  05/07/15 0755 05/08/15 0618  WBC 9.4 14.4*  HGB 10.8* 10.6*  PLT 167 158   Blood type: --/--/A POS (02/02 0755) Rubella: Immune (11/07 0000)                I&O: Intake/Output      02/03 0701 - 02/04 0700 02/04 0701 - 02/05 0700   Urine (mL/kg/hr)     Blood     Total Output       Net              Physical Exam: Alert and oriented X3 Abdomen: soft, non-tender, non-distended  Fundus: firm, non-tender, U-2 Perineum: Well approximated, no significant erythema, edema, or drainage; healing well. Lochia: small Extremities: No edema, no calf pain or tenderness   Assessment: PPD #2  G3P2012/ S/P:vacuum extraction Doing well - stable for discharge home  Plan: Discharge home RX's:  Ibuprofen  po Q 6 hrs prn pain #30 Refill x 0 Follow up in 6 wks at [facility] for postpartum visit [facility] booklet given    Donette Larry, N MSN, CNM 05/09/2015, 11:35 AM

## 2020-11-20 ENCOUNTER — Ambulatory Visit
Admission: EM | Admit: 2020-11-20 | Discharge: 2020-11-20 | Disposition: A | Discharge status: Home / Self Care | Payer: 59 | Attending: Urgent Care | Admitting: Urgent Care

## 2020-11-20 ENCOUNTER — Encounter: Payer: Self-pay | Admitting: Emergency Medicine

## 2020-11-20 ENCOUNTER — Other Ambulatory Visit: Payer: Self-pay

## 2020-11-20 DIAGNOSIS — N898 Other specified noninflammatory disorders of vagina: Secondary | ICD-10-CM | POA: Diagnosis not present

## 2020-11-20 DIAGNOSIS — Z789 Other specified health status: Secondary | ICD-10-CM | POA: Diagnosis not present

## 2020-11-20 LAB — POCT URINALYSIS DIP (MANUAL ENTRY)
Bilirubin, UA: NEGATIVE
Blood, UA: NEGATIVE
Glucose, UA: NEGATIVE mg/dL
Ketones, POC UA: NEGATIVE mg/dL
Leukocytes, UA: NEGATIVE
Nitrite, UA: NEGATIVE
Protein Ur, POC: NEGATIVE mg/dL
Spec Grav, UA: 1.02 (ref 1.010–1.025)
Urobilinogen, UA: 0.2 E.U./dL
pH, UA: 6.5 (ref 5.0–8.0)

## 2020-11-20 LAB — POCT URINE PREGNANCY: Preg Test, Ur: NEGATIVE

## 2020-11-20 NOTE — ED Triage Notes (Addendum)
Noticed new clumpy white vaginal discharge with inner and outer vaginal irritation yesterday after recent unprotected sex on Tuesday. Requesting STD check and pregnancy test

## 2020-11-20 NOTE — ED Provider Notes (Signed)
[facility]   MRN: [id number] DOB: 1991/02/02  Subjective:   Danielle Christensen is a 30 y.o. female presenting for white clumpy white discharge, intermittent vaginal itching.  Patient usually uses condoms for sex, unfortunately the condom broke this last time.  She would like to make sure that she is not pregnant, wants to have an STI check.  She does have a remote history of bacterial vaginosis and yeast infection. Denies fever, n/v, abdominal pain, pelvic pain, rashes, dysuria, urinary frequency, hematuria.    No current facility-administered medications for this encounter.  Current Outpatient Medications:    Prenatal Vit-Fe Fumarate-FA (PRENATAL MULTIVITAMIN) TABS tablet, Take 2 tablets by mouth daily at 12 noon., Disp: , Rfl:    ibuprofen (ADVIL,MOTRIN) 800 MG tablet, Take 1 tablet (800 mg total) by mouth every 8 (eight) hours., Disp: 30 tablet, Rfl: 0   No Known Allergies  Past Medical History:  Diagnosis Date   Anemia    Asthma    Infection    UTI   Postpartum care following vaginal delivery 05/07/2015     Past Surgical History:  Procedure Laterality Date   KNEE ARTHROSCOPY     KNEE SURGERY      Family History  Problem Relation Age of Onset   Cancer Maternal Grandmother        maternal   Hearing loss Neg Hx     Social History   Tobacco Use   Smoking status: Never   Smokeless tobacco: Never  Substance Use Topics   Alcohol use: No   Drug use: No    ROS   Objective:   Vitals: BP 108/66 (BP Location: Right Arm)   Pulse 85   Temp 98.4 F (36.9 C) (Oral)   Resp 16   LMP 10/20/2020   SpO2 97%   Physical Exam Constitutional:      General: She is not in acute distress.    Appearance: Normal appearance. She is well-developed. She is not ill-appearing, toxic-appearing or diaphoretic.  HENT:     Head: Normocephalic and atraumatic.     Nose: Nose normal.     Mouth/Throat:     Mouth: Mucous membranes are moist.     Pharynx: Oropharynx is  clear.  Eyes:     General: No scleral icterus.       Right eye: No discharge.        Left eye: No discharge.     Extraocular Movements: Extraocular movements intact.     Conjunctiva/sclera: Conjunctivae normal.     Pupils: Pupils are equal, round, and reactive to light.  Cardiovascular:     Rate and Rhythm: Normal rate.  Pulmonary:     Effort: Pulmonary effort is normal.  Abdominal:     General: Bowel sounds are normal. There is no distension.     Palpations: Abdomen is soft. There is no mass.     Tenderness: There is no abdominal tenderness. There is no right CVA tenderness, left CVA tenderness, guarding or rebound.  Skin:    General: Skin is warm and dry.  Neurological:     General: No focal deficit present.     Mental Status: She is alert and oriented to person, place, and time.  Psychiatric:        Mood and Affect: Mood normal.        Behavior: Behavior normal.        Thought Content: Thought content normal.        Judgment: Judgment normal.  Results for orders placed or performed during the hospital encounter of 11/20/20 (from the past 24 hour(s))  POCT urine pregnancy     Status: None   Collection Time: 11/20/20  8:51 AM  Result Value Ref Range   Preg Test, Ur Negative Negative  POCT urinalysis dipstick     Status: None   Collection Time: 11/20/20  8:51 AM  Result Value Ref Range   Color, UA yellow yellow   Clarity, UA clear clear   Glucose, UA negative negative mg/dL   Bilirubin, UA negative negative   Ketones, POC UA negative negative mg/dL   Spec Grav, UA 8.638 1.771 - 1.025   Blood, UA negative negative   pH, UA 6.5 5.0 - 8.0   Protein Ur, POC negative negative mg/dL   Urobilinogen, UA 0.2 0.2 or 1.0 E.U./dL   Nitrite, UA Negative Negative   Leukocytes, UA Negative Negative    Assessment and Plan :   PDMP not reviewed this encounter.  1. Vaginal discharge   2. Usually uses condoms for sexual activity     Patient prefers to hold off on empiric  treatment, would like to base any prescription medications off of her results.  We will notify her of her results and treatments necessary.   Wallis Bamberg, [place] 11/20/20 1657

## 2020-11-23 LAB — CERVICOVAGINAL ANCILLARY ONLY
Bacterial Vaginitis (gardnerella): NEGATIVE
Candida Glabrata: NEGATIVE
Candida Vaginitis: NEGATIVE
Chlamydia: NEGATIVE
Comment: NEGATIVE
Comment: NEGATIVE
Comment: NEGATIVE
Comment: NEGATIVE
Comment: NEGATIVE
Comment: NORMAL
Neisseria Gonorrhea: NEGATIVE
Trichomonas: NEGATIVE

## 2020-12-14 ENCOUNTER — Encounter: Payer: 59 | Admitting: Obstetrics & Gynecology

## 2020-12-14 ENCOUNTER — Encounter: Payer: Self-pay | Admitting: *Deleted

## 2021-03-03 ENCOUNTER — Other Ambulatory Visit: Payer: Self-pay

## 2021-03-03 ENCOUNTER — Ambulatory Visit: Payer: 59

## 2021-03-03 ENCOUNTER — Ambulatory Visit
Admission: EM | Admit: 2021-03-03 | Discharge: 2021-03-03 | Disposition: A | Discharge status: Other Acute Inpatient Hospital | Payer: 59

## 2023-02-14 ENCOUNTER — Ambulatory Visit (HOSPITAL_COMMUNITY)
Admission: RE | Admit: 2023-02-14 | Discharge: 2023-02-14 | Disposition: A | Discharge status: Home / Self Care | Payer: 59 | Source: Ambulatory Visit | Attending: Physician Assistant | Admitting: Physician Assistant

## 2023-02-14 ENCOUNTER — Encounter (HOSPITAL_COMMUNITY): Payer: Self-pay

## 2023-02-14 VITALS — BP 104/67 | HR 68 | Temp 98.2°F | Resp 16

## 2023-02-14 DIAGNOSIS — N898 Other specified noninflammatory disorders of vagina: Secondary | ICD-10-CM | POA: Diagnosis present

## 2023-02-14 DIAGNOSIS — Z3202 Encounter for pregnancy test, result negative: Secondary | ICD-10-CM | POA: Insufficient documentation

## 2023-02-14 DIAGNOSIS — Z113 Encounter for screening for infections with a predominantly sexual mode of transmission: Secondary | ICD-10-CM | POA: Diagnosis present

## 2023-02-14 LAB — POCT URINE PREGNANCY: Preg Test, Ur: NEGATIVE

## 2023-02-14 LAB — HIV ANTIBODY (ROUTINE TESTING W REFLEX): HIV Screen 4th Generation wRfx: NONREACTIVE

## 2023-02-14 MED ORDER — MICONAZOLE NITRATE 2 % VA CREA: 1.0000 | TOPICAL_CREAM | Freq: Every day | VAGINAL | 0 refills | Status: AC

## 2023-02-14 NOTE — Discharge Instructions (Signed)
Your urine pregnancy test was negative.  Given you have itching and irritation I am concerned for a yeast infection.  Apply vaginal cream nightly to help with management.  We have sent off testing and we will contact you if anything else is positive and we need to arrange treatment.  Wear loosefitting cotton underwear and use hypoallergenic soaps and detergents.  If you develop any discharge, pelvic pain, fever, nausea, vomiting, abdominal pain you need to be seen immediately.

## 2023-02-14 NOTE — ED Provider Notes (Signed)
[facility]    CSN: [id number] Arrival date & time: 02/14/23  1607      History   Chief Complaint Chief Complaint  Patient presents with   SEXUALLY TRANSMITTED DISEASE    HPI KHYLI WEYER is a 32 y.o. female.   Patient presents today with a 3-day history of vaginal irritation including itching, swelling, discomfort.  She denies any discharge, odor, fever, nausea, vomiting, pelvic pain, genital lesions.  She has a history of BV and reports current symptoms are similar previous episodes of this condition.  She has been treated with antibiotics (azithromycin) in September 2024 for sinus infection but denies additional antibiotic use since then.  She denies any changes to her medications, history of diabetes, SGLT2 inhibitor use.  She is sexually active with female partners and typically does use condoms but reports an episode where the condom broke several weeks ago.  She is interested in complete STI panel.  She has not tried any over-the-counter medication for symptom management.  She does not believe that she could be pregnant but is not confident and so is open to testing.    Past Medical History:  Diagnosis Date   Anemia    Asthma    Infection    UTI   Postpartum care following vaginal delivery 05/07/2015    Patient Active Problem List   Diagnosis Date Noted   Pregnancy 05/07/2015   Status post vacuum-assisted vaginal delivery (2/2) 05/07/2015    Past Surgical History:  Procedure Laterality Date   KNEE ARTHROSCOPY     KNEE SURGERY      OB History     Gravida  3   Para  2   Term  2   Preterm  0   AB  1   Living  2      SAB  1   IAB  0   Ectopic  0   Multiple  0   Live Births  2            Home Medications    Prior to Admission medications   Medication Sig Start Date End Date Taking? Authorizing Provider  miconazole (MICONAZOLE 7) 2 % vaginal cream Place 1 Applicatorful vaginally at bedtime for 7 days. 02/14/23 02/21/23  Yes Vergie Zahm, Noberto Retort, PA-C    Family History Family History  Problem Relation Age of Onset   Cancer Maternal Grandmother        maternal   Hearing loss Neg Hx     Social History Social History   Tobacco Use   Smoking status: Never   Smokeless tobacco: Never  Vaping Use   Vaping status: Never Used  Substance Use Topics   Alcohol use: No   Drug use: No     Allergies   Amoxicillin and Mushroom extract complex   Review of Systems Review of Systems  Constitutional:  Negative for activity change, appetite change, fatigue and fever.  Gastrointestinal:  Negative for abdominal pain, diarrhea, nausea and vomiting.  Genitourinary:  Positive for vaginal pain (irritation). Negative for dysuria, frequency, pelvic pain, urgency, vaginal bleeding and vaginal discharge.  Musculoskeletal:  Negative for back pain.     Physical Exam Triage Vital Signs ED Triage Vitals  Encounter Vitals Group     BP 02/14/23 1621 104/67     Systolic BP Percentile --      Diastolic BP Percentile --      Pulse Rate 02/14/23 1621 68     Resp 02/14/23 1621 16  Temp 02/14/23 1621 98.2 F (36.8 C)     Temp Source 02/14/23 1621 Oral     SpO2 02/14/23 1621 98 %     Weight --      Height --      Head Circumference --      Peak Flow --      Pain Score 02/14/23 1619 2     Pain Loc --      Pain Education --      Exclude from Growth Chart --    No data found.  Updated Vital Signs BP 104/67 (BP Location: Right Arm)   Pulse 68   Temp 98.2 F (36.8 C) (Oral)   Resp 16   LMP 01/22/2023 (Exact Date)   SpO2 98%   Visual Acuity Right Eye Distance:   Left Eye Distance:   Bilateral Distance:    Right Eye Near:   Left Eye Near:    Bilateral Near:     Physical Exam Vitals reviewed.  Constitutional:      General: She is awake. She is not in acute distress.    Appearance: Normal appearance. She is well-developed. She is not ill-appearing.     Comments: Very pleasant female appears stated age in  no acute distress sitting comfortably in exam room  HENT:     Head: Normocephalic and atraumatic.  Cardiovascular:     Rate and Rhythm: Normal rate and regular rhythm.     Heart sounds: Normal heart sounds, S1 normal and S2 normal. No murmur heard. Pulmonary:     Effort: Pulmonary effort is normal.     Breath sounds: Normal breath sounds. No wheezing, rhonchi or rales.     Comments: Clear to auscultation bilaterally  Abdominal:     General: Bowel sounds are normal.     Palpations: Abdomen is soft.     Tenderness: There is no abdominal tenderness. There is no right CVA tenderness, left CVA tenderness, guarding or rebound.  Genitourinary:    Comments: Exam deferred Psychiatric:        Behavior: Behavior is cooperative.      UC Treatments / Results  Labs (all labs ordered are listed, but only abnormal results are displayed) Labs Reviewed  POCT URINE PREGNANCY - Normal  HIV ANTIBODY (ROUTINE TESTING W REFLEX)  RPR  CERVICOVAGINAL ANCILLARY ONLY    EKG   Radiology No results found.  Procedures Procedures (including critical care time)  Medications Ordered in UC Medications - No data to display  Initial Impression / Assessment and Plan / UC Course  I have reviewed the triage vital signs and the nursing notes.  Pertinent labs & imaging results that were available during my care of the patient were reviewed by me and considered in my medical decision making (see chart for details).     Patient is well-appearing, afebrile, nontoxic, nontachycardic.  Urine pregnancy test was obtained and was negative.  Given her itching and irritation I am more concerned for yeast infection.  Will start topical miconazole and this was sent to the pharmacy.  Swab for gonorrhea, chlamydia, trichomonas, BV, yeast was obtained and will treat accordingly based on these results.  Blood testing for HIV and syphilis was obtained and is pending.  She was encouraged to wear loosefitting cotton  underwear and use hypoallergenic soaps and detergents.  We discussed that if anything changes or worsens and she has vaginal discharge, pelvic pain, fever, nausea, vomiting, abdominal pain she needs to be seen immediately.  Strict return  precautions given.  All questions answered to patient satisfaction.  She does have monitor will monitor this for her results.  Final Clinical Impressions(s) / UC Diagnoses   Final diagnoses:  Vaginal irritation  Screening examination for STI  Encounter for pregnancy test, result negative     Discharge Instructions      Your urine pregnancy test was negative.  Given you have itching and irritation I am concerned for a yeast infection.  Apply vaginal cream nightly to help with management.  We have sent off testing and we will contact you if anything else is positive and we need to arrange treatment.  Wear loosefitting cotton underwear and use hypoallergenic soaps and detergents.  If you develop any discharge, pelvic pain, fever, nausea, vomiting, abdominal pain you need to be seen immediately.     ED Prescriptions     Medication Sig Dispense Auth. Provider   miconazole (MICONAZOLE 7) 2 % vaginal cream Place 1 Applicatorful vaginally at bedtime for 7 days. 45 g Azel Gumina K, PA-C      PDMP not reviewed this encounter.   Jeani Hawking, PA-C 02/14/23 1701

## 2023-02-14 NOTE — ED Triage Notes (Signed)
Pt states she is having vaginal itching, discomfort and swelling X 3 days. She hasn't tried any OTC meds.   She states she uses condoms but sometimes they break.

## 2023-02-15 LAB — CERVICOVAGINAL ANCILLARY ONLY
Bacterial Vaginitis (gardnerella): NEGATIVE
Candida Glabrata: NEGATIVE
Candida Vaginitis: POSITIVE — AB
Chlamydia: NEGATIVE
Comment: NEGATIVE
Comment: NEGATIVE
Comment: NEGATIVE
Comment: NEGATIVE
Comment: NEGATIVE
Comment: NORMAL
Neisseria Gonorrhea: NEGATIVE
Trichomonas: NEGATIVE

## 2023-02-15 LAB — RPR: RPR Ser Ql: NONREACTIVE

## 2023-04-14 ENCOUNTER — Inpatient Hospital Stay (HOSPITAL_COMMUNITY): Payer: 59 | Admitting: Anesthesiology

## 2023-04-14 ENCOUNTER — Encounter (HOSPITAL_COMMUNITY): Payer: Self-pay | Admitting: Emergency Medicine

## 2023-04-14 ENCOUNTER — Other Ambulatory Visit: Payer: Self-pay

## 2023-04-14 ENCOUNTER — Emergency Department (HOSPITAL_COMMUNITY): Payer: 59

## 2023-04-14 ENCOUNTER — Inpatient Hospital Stay (EMERGENCY_DEPARTMENT_HOSPITAL): Payer: 59 | Admitting: Anesthesiology

## 2023-04-14 ENCOUNTER — Inpatient Hospital Stay (HOSPITAL_COMMUNITY)
Admission: EM | Admit: 2023-04-14 | Discharge: 2023-04-14 | Disposition: A | Discharge status: Home / Self Care | Payer: 59 | Attending: Emergency Medicine | Admitting: Emergency Medicine

## 2023-04-14 ENCOUNTER — Encounter (HOSPITAL_COMMUNITY)
Admission: EM | Disposition: A | Discharge status: Home / Self Care | Payer: Self-pay | Source: Home / Self Care | Attending: Emergency Medicine

## 2023-04-14 ENCOUNTER — Other Ambulatory Visit (HOSPITAL_COMMUNITY): Payer: 59

## 2023-04-14 DIAGNOSIS — O009 Unspecified ectopic pregnancy without intrauterine pregnancy: Secondary | ICD-10-CM | POA: Diagnosis not present

## 2023-04-14 DIAGNOSIS — I959 Hypotension, unspecified: Secondary | ICD-10-CM | POA: Insufficient documentation

## 2023-04-14 DIAGNOSIS — O00101 Right tubal pregnancy without intrauterine pregnancy: Secondary | ICD-10-CM | POA: Diagnosis present

## 2023-04-14 DIAGNOSIS — K661 Hemoperitoneum: Secondary | ICD-10-CM | POA: Insufficient documentation

## 2023-04-14 DIAGNOSIS — Z3A00 Weeks of gestation of pregnancy not specified: Secondary | ICD-10-CM | POA: Diagnosis not present

## 2023-04-14 DIAGNOSIS — Z3201 Encounter for pregnancy test, result positive: Secondary | ICD-10-CM

## 2023-04-14 DIAGNOSIS — R102 Pelvic and perineal pain: Secondary | ICD-10-CM

## 2023-04-14 HISTORY — PX: DIAGNOSTIC LAPAROSCOPY WITH REMOVAL OF ECTOPIC PREGNANCY: SHX6449

## 2023-04-14 LAB — COMPREHENSIVE METABOLIC PANEL
ALT: 20 U/L (ref 0–44)
ALT: 18 U/L (ref 0–44)
AST: 25 U/L (ref 15–41)
AST: 23 U/L (ref 15–41)
Albumin: 3.2 g/dL — ABNORMAL LOW (ref 3.5–5.0)
Albumin: 2.9 g/dL — ABNORMAL LOW (ref 3.5–5.0)
Alkaline Phosphatase: 43 U/L (ref 38–126)
Alkaline Phosphatase: 37 U/L — ABNORMAL LOW (ref 38–126)
Anion gap: 13 (ref 5–15)
Anion gap: 8 (ref 5–15)
BUN: 9 mg/dL (ref 6–20)
BUN: 6 mg/dL (ref 6–20)
CO2: 20 mmol/L — ABNORMAL LOW (ref 22–32)
CO2: 21 mmol/L — ABNORMAL LOW (ref 22–32)
Calcium: 8.8 mg/dL — ABNORMAL LOW (ref 8.9–10.3)
Calcium: 7.5 mg/dL — ABNORMAL LOW (ref 8.9–10.3)
Chloride: 102 mmol/L (ref 98–111)
Chloride: 108 mmol/L (ref 98–111)
Creatinine, Ser: 1.22 mg/dL — ABNORMAL HIGH (ref 0.44–1.00)
Creatinine, Ser: 0.79 mg/dL (ref 0.44–1.00)
GFR, Estimated: >60 mL/min (ref >60)
GFR, Estimated: >60 mL/min (ref >60)
Glucose, Bld: 173 mg/dL — ABNORMAL HIGH (ref 70–99)
Glucose, Bld: 144 mg/dL — ABNORMAL HIGH (ref 70–99)
Potassium: 3.3 mmol/L — ABNORMAL LOW (ref 3.5–5.1)
Potassium: 4.1 mmol/L (ref 3.5–5.1)
Sodium: 135 mmol/L (ref 135–145)
Sodium: 137 mmol/L (ref 135–145)
Total Bilirubin: 0.6 mg/dL (ref 0.0–1.2)
Total Bilirubin: 1 mg/dL (ref 0.0–1.2)
Total Protein: 6.1 g/dL — ABNORMAL LOW (ref 6.5–8.1)
Total Protein: 5.6 g/dL — ABNORMAL LOW (ref 6.5–8.1)

## 2023-04-14 LAB — CBC
HCT: 25.7 % — ABNORMAL LOW (ref 36.0–46.0)
Hemoglobin: 8.5 g/dL — ABNORMAL LOW (ref 12.0–15.0)
MCH: 30.5 pg (ref 26.0–34.0)
MCHC: 33.1 g/dL (ref 30.0–36.0)
MCV: 92.1 fL (ref 80.0–100.0)
Platelets: 172 10*3/uL (ref 150–400)
RBC: 2.79 MIL/uL — ABNORMAL LOW (ref 3.87–5.11)
RDW: 12.6 % (ref 11.5–15.5)
WBC: 10.9 10*3/uL — ABNORMAL HIGH (ref 4.0–10.5)
nRBC: 0 % (ref 0.0–0.2)

## 2023-04-14 LAB — URINALYSIS, ROUTINE W REFLEX MICROSCOPIC
Bilirubin Urine: NEGATIVE
Glucose, UA: 50 mg/dL — AB
Hgb urine dipstick: NEGATIVE
Ketones, ur: NEGATIVE mg/dL
Leukocytes,Ua: NEGATIVE
Nitrite: NEGATIVE
Protein, ur: NEGATIVE mg/dL
Specific Gravity, Urine: 1.02 (ref 1.005–1.030)
pH: 5 (ref 5.0–8.0)

## 2023-04-14 LAB — CBG MONITORING, ED: Glucose-Capillary: 155 mg/dL — ABNORMAL HIGH (ref 70–99)

## 2023-04-14 LAB — CBC WITH DIFFERENTIAL/PLATELET
Abs Immature Granulocytes: 0.05 10*3/uL (ref 0.00–0.07)
Abs Immature Granulocytes: 0.05 10*3/uL (ref 0.00–0.07)
Basophils Absolute: 0 10*3/uL (ref 0.0–0.1)
Basophils Absolute: 0 10*3/uL (ref 0.0–0.1)
Basophils Relative: 0 %
Basophils Relative: 0 %
Eosinophils Absolute: 0 10*3/uL (ref 0.0–0.5)
Eosinophils Absolute: 0 10*3/uL (ref 0.0–0.5)
Eosinophils Relative: 0 %
Eosinophils Relative: 0 %
HCT: 33.1 % — ABNORMAL LOW (ref 36.0–46.0)
HCT: 32.3 % — ABNORMAL LOW (ref 36.0–46.0)
Hemoglobin: 11.1 g/dL — ABNORMAL LOW (ref 12.0–15.0)
Hemoglobin: 10.7 g/dL — ABNORMAL LOW (ref 12.0–15.0)
Immature Granulocytes: 0 %
Immature Granulocytes: 0 %
Lymphocytes Relative: 9 %
Lymphocytes Relative: 5 %
Lymphs Abs: 1.2 10*3/uL (ref 0.7–4.0)
Lymphs Abs: 0.6 10*3/uL — ABNORMAL LOW (ref 0.7–4.0)
MCH: 30.7 pg (ref 26.0–34.0)
MCH: 30.7 pg (ref 26.0–34.0)
MCHC: 33.5 g/dL (ref 30.0–36.0)
MCHC: 33.1 g/dL (ref 30.0–36.0)
MCV: 91.7 fL (ref 80.0–100.0)
MCV: 92.6 fL (ref 80.0–100.0)
Monocytes Absolute: 0.6 10*3/uL (ref 0.1–1.0)
Monocytes Absolute: 0.3 10*3/uL (ref 0.1–1.0)
Monocytes Relative: 5 %
Monocytes Relative: 2 %
Neutro Abs: 10.9 10*3/uL — ABNORMAL HIGH (ref 1.7–7.7)
Neutro Abs: 10.9 10*3/uL — ABNORMAL HIGH (ref 1.7–7.7)
Neutrophils Relative %: 86 %
Neutrophils Relative %: 93 %
Platelets: 268 10*3/uL (ref 150–400)
Platelets: 193 10*3/uL (ref 150–400)
RBC: 3.61 MIL/uL — ABNORMAL LOW (ref 3.87–5.11)
RBC: 3.49 MIL/uL — ABNORMAL LOW (ref 3.87–5.11)
RDW: 12.7 % (ref 11.5–15.5)
RDW: 13.2 % (ref 11.5–15.5)
WBC: 12.7 10*3/uL — ABNORMAL HIGH (ref 4.0–10.5)
WBC: 11.8 10*3/uL — ABNORMAL HIGH (ref 4.0–10.5)
nRBC: 0 % (ref 0.0–0.2)
nRBC: 0 % (ref 0.0–0.2)

## 2023-04-14 LAB — PREPARE RBC (CROSSMATCH)

## 2023-04-14 LAB — FIBRINOGEN: Fibrinogen: 281 mg/dL (ref 210–475)

## 2023-04-14 LAB — HCG, SERUM, QUALITATIVE: Preg, Serum: POSITIVE — AB

## 2023-04-14 LAB — SAMPLE TO BLOOD BANK

## 2023-04-14 LAB — HCG, QUANTITATIVE, PREGNANCY: hCG, Beta Chain, Quant, S: 5448 m[IU]/mL — ABNORMAL HIGH (ref <5)

## 2023-04-14 LAB — PROTIME-INR
INR: 1.3 — ABNORMAL HIGH (ref 0.8–1.2)
Prothrombin Time: 15.9 s — ABNORMAL HIGH (ref 11.4–15.2)

## 2023-04-14 LAB — APTT: aPTT: 24 s (ref 24–36)

## 2023-04-14 SURGERY — LAPAROSCOPY, WITH ECTOPIC PREGNANCY SURGICAL TREATMENT
Anesthesia: General

## 2023-04-14 MED ORDER — HYDROMORPHONE HCL 1 MG/ML IJ SOLN
1.0000 mg | Freq: Once | INTRAMUSCULAR | Status: AC
  Administered 2023-04-14: 1 mg via INTRAVENOUS
  Filled 2023-04-14: qty 1

## 2023-04-14 MED ORDER — ONDANSETRON HCL 4 MG/2ML IJ SOLN
INTRAMUSCULAR | Status: AC
  Filled 2023-04-14: qty 2

## 2023-04-14 MED ORDER — BUPIVACAINE HCL (PF) 0.25 % IJ SOLN
INTRAMUSCULAR | Status: DC | PRN
  Administered 2023-04-14: 20 mL

## 2023-04-14 MED ORDER — HYDROMORPHONE HCL 1 MG/ML IJ SOLN
INTRAMUSCULAR | Status: DC | PRN
  Administered 2023-04-14: .25 mg via INTRAVENOUS

## 2023-04-14 MED ORDER — LACTATED RINGERS IV SOLN: INTRAVENOUS | Status: DC

## 2023-04-14 MED ORDER — AMISULPRIDE (ANTIEMETIC) 5 MG/2ML IV SOLN
INTRAVENOUS | Status: AC
  Filled 2023-04-14: qty 4

## 2023-04-14 MED ORDER — HYDROMORPHONE HCL 1 MG/ML IJ SOLN
1.0000 mg | Freq: Once | INTRAMUSCULAR | Status: AC
  Administered 2023-04-14: 1 mg via INTRAVENOUS

## 2023-04-14 MED ORDER — SODIUM CHLORIDE 0.9 % IV SOLN: INTRAVENOUS | Status: DC | PRN

## 2023-04-14 MED ORDER — ONDANSETRON HCL 4 MG/2ML IJ SOLN
4.0000 mg | Freq: Once | INTRAMUSCULAR | Status: AC
  Administered 2023-04-14: 4 mg via INTRAVENOUS
  Filled 2023-04-14: qty 2

## 2023-04-14 MED ORDER — MIDAZOLAM HCL 2 MG/2ML IJ SOLN
INTRAMUSCULAR | Status: AC
  Filled 2023-04-14: qty 2

## 2023-04-14 MED ORDER — DEXAMETHASONE SODIUM PHOSPHATE 10 MG/ML IJ SOLN
INTRAMUSCULAR | Status: AC
  Filled 2023-04-14: qty 1

## 2023-04-14 MED ORDER — PROPOFOL 10 MG/ML IV BOLUS
INTRAVENOUS | Status: AC
  Filled 2023-04-14: qty 20

## 2023-04-14 MED ORDER — LIDOCAINE 2% (20 MG/ML) 5 ML SYRINGE
INTRAMUSCULAR | Status: AC
  Filled 2023-04-14: qty 5

## 2023-04-14 MED ORDER — BUPIVACAINE HCL (PF) 0.25 % IJ SOLN
INTRAMUSCULAR | Status: AC
  Filled 2023-04-14: qty 30

## 2023-04-14 MED ORDER — SUGAMMADEX SODIUM 200 MG/2ML IV SOLN
INTRAVENOUS | Status: DC | PRN
  Administered 2023-04-14: 150 mg via INTRAVENOUS

## 2023-04-14 MED ORDER — KETOROLAC TROMETHAMINE 15 MG/ML IJ SOLN: 15.0000 mg | Freq: Once | INTRAMUSCULAR | Status: DC

## 2023-04-14 MED ORDER — HYDROMORPHONE HCL 1 MG/ML IJ SOLN
INTRAMUSCULAR | Status: AC
  Filled 2023-04-14: qty 1

## 2023-04-14 MED ORDER — IBUPROFEN 600 MG PO TABS: 600.0000 mg | ORAL_TABLET | Freq: Four times a day (QID) | ORAL | 0 refills | Status: AC

## 2023-04-14 MED ORDER — ROCURONIUM BROMIDE 10 MG/ML (PF) SYRINGE
PREFILLED_SYRINGE | INTRAVENOUS | Status: AC
  Filled 2023-04-14: qty 10

## 2023-04-14 MED ORDER — OXYCODONE HCL 5 MG/5ML PO SOLN
ORAL | Status: AC
  Filled 2023-04-14: qty 5

## 2023-04-14 MED ORDER — LIDOCAINE 2% (20 MG/ML) 5 ML SYRINGE
INTRAMUSCULAR | Status: DC | PRN
  Administered 2023-04-14: 100 mg via INTRAVENOUS

## 2023-04-14 MED ORDER — SODIUM CHLORIDE 0.9 % IV BOLUS
1000.0000 mL | Freq: Once | INTRAVENOUS | Status: AC
  Administered 2023-04-14: 1000 mL via INTRAVENOUS

## 2023-04-14 MED ORDER — ROCURONIUM BROMIDE 10 MG/ML (PF) SYRINGE
PREFILLED_SYRINGE | INTRAVENOUS | Status: DC | PRN
  Administered 2023-04-14: 10 mg via INTRAVENOUS
  Administered 2023-04-14: 50 mg via INTRAVENOUS

## 2023-04-14 MED ORDER — PROPOFOL 10 MG/ML IV BOLUS
INTRAVENOUS | Status: DC | PRN
  Administered 2023-04-14: 150 mg via INTRAVENOUS

## 2023-04-14 MED ORDER — ONDANSETRON HCL 4 MG/2ML IJ SOLN
INTRAMUSCULAR | Status: DC | PRN
  Administered 2023-04-14: 4 mg via INTRAVENOUS

## 2023-04-14 MED ORDER — FENTANYL CITRATE (PF) 250 MCG/5ML IJ SOLN
INTRAMUSCULAR | Status: DC | PRN
  Administered 2023-04-14 (×5): 50 ug via INTRAVENOUS

## 2023-04-14 MED ORDER — OXYCODONE HCL 5 MG/5ML PO SOLN
5.0000 mg | Freq: Once | ORAL | Status: AC | PRN
  Administered 2023-04-14: 5 mg via ORAL

## 2023-04-14 MED ORDER — FENTANYL CITRATE (PF) 250 MCG/5ML IJ SOLN
INTRAMUSCULAR | Status: AC
  Filled 2023-04-14: qty 5

## 2023-04-14 MED ORDER — 0.9 % SODIUM CHLORIDE (POUR BTL) OPTIME
TOPICAL | Status: DC | PRN
  Administered 2023-04-14: 1000 mL

## 2023-04-14 MED ORDER — GABAPENTIN 300 MG PO CAPS: 300.0000 mg | ORAL_CAPSULE | Freq: Three times a day (TID) | ORAL | 0 refills | Status: AC

## 2023-04-14 MED ORDER — FENTANYL CITRATE (PF) 100 MCG/2ML IJ SOLN: 25.0000 ug | INTRAMUSCULAR | Status: DC | PRN

## 2023-04-14 MED ORDER — ACETAMINOPHEN 500 MG PO TABS: 500.0000 mg | ORAL_TABLET | Freq: Four times a day (QID) | ORAL | Status: AC

## 2023-04-14 MED ORDER — OXYCODONE HCL 5 MG PO TABS: 5.0000 mg | ORAL_TABLET | ORAL | 0 refills | Status: AC | PRN

## 2023-04-14 MED ORDER — ORAL CARE MOUTH RINSE: 15.0000 mL | Freq: Once | OROMUCOSAL | Status: AC

## 2023-04-14 MED ORDER — CHLORHEXIDINE GLUCONATE 0.12 % MT SOLN
15.0000 mL | Freq: Once | OROMUCOSAL | Status: AC
  Administered 2023-04-14: 15 mL via OROMUCOSAL

## 2023-04-14 MED ORDER — SODIUM CHLORIDE 0.9 % IV BOLUS
2000.0000 mL | Freq: Once | INTRAVENOUS | Status: AC
  Administered 2023-04-14: 2000 mL via INTRAVENOUS

## 2023-04-14 MED ORDER — AMISULPRIDE (ANTIEMETIC) 5 MG/2ML IV SOLN
10.0000 mg | Freq: Once | INTRAVENOUS | Status: AC | PRN
  Administered 2023-04-14: 10 mg via INTRAVENOUS

## 2023-04-14 MED ORDER — SUCCINYLCHOLINE CHLORIDE 200 MG/10ML IV SOSY
PREFILLED_SYRINGE | INTRAVENOUS | Status: AC
  Filled 2023-04-14: qty 10

## 2023-04-14 MED ORDER — KETOROLAC TROMETHAMINE 15 MG/ML IJ SOLN
INTRAMUSCULAR | Status: AC
  Filled 2023-04-14: qty 1

## 2023-04-14 MED ORDER — HYDROMORPHONE HCL 1 MG/ML IJ SOLN
INTRAMUSCULAR | Status: AC
  Filled 2023-04-14: qty 0.5

## 2023-04-14 MED ORDER — DEXAMETHASONE SODIUM PHOSPHATE 10 MG/ML IJ SOLN
INTRAMUSCULAR | Status: DC | PRN
  Administered 2023-04-14: 5 mg via INTRAVENOUS

## 2023-04-14 MED ORDER — MIDAZOLAM HCL 2 MG/2ML IJ SOLN
INTRAMUSCULAR | Status: DC | PRN
  Administered 2023-04-14: 2 mg via INTRAVENOUS

## 2023-04-14 MED ORDER — OXYCODONE HCL 5 MG PO TABS: 5.0000 mg | ORAL_TABLET | Freq: Once | ORAL | Status: AC | PRN

## 2023-04-14 MED ORDER — PHENYLEPHRINE 80 MCG/ML (10ML) SYRINGE FOR IV PUSH (FOR BLOOD PRESSURE SUPPORT)
PREFILLED_SYRINGE | INTRAVENOUS | Status: DC | PRN
  Administered 2023-04-14: 80 ug via INTRAVENOUS
  Administered 2023-04-14 (×2): 160 ug via INTRAVENOUS
  Administered 2023-04-14 (×2): 80 ug via INTRAVENOUS
  Administered 2023-04-14: 160 ug via INTRAVENOUS

## 2023-04-14 MED ORDER — SUCCINYLCHOLINE CHLORIDE 200 MG/10ML IV SOSY
PREFILLED_SYRINGE | INTRAVENOUS | Status: DC | PRN
  Administered 2023-04-14: 100 mg via INTRAVENOUS

## 2023-04-14 MED ORDER — PHENYLEPHRINE 80 MCG/ML (10ML) SYRINGE FOR IV PUSH (FOR BLOOD PRESSURE SUPPORT)
PREFILLED_SYRINGE | INTRAVENOUS | Status: AC
  Filled 2023-04-14: qty 20

## 2023-04-14 SURGICAL SUPPLY — 39 items
CABLE HIGH FREQUENCY MONO STRZ (ELECTRODE) IMPLANT
CATH ROBINSON RED A/P 16FR (CATHETERS) ×1 IMPLANT
CHLORAPREP W/TINT 26 (MISCELLANEOUS) IMPLANT
COVER BACK TABLE 60X90IN (DRAPES) IMPLANT
COVER SURGICAL LIGHT HANDLE (MISCELLANEOUS) IMPLANT
DERMABOND ADVANCED .7 DNX12 (GAUZE/BANDAGES/DRESSINGS) ×1 IMPLANT
DRSG OPSITE POSTOP 3X4 (GAUZE/BANDAGES/DRESSINGS) IMPLANT
DURAPREP 26ML APPLICATOR (WOUND CARE) ×1 IMPLANT
FILTER SMOKE EVAC LAPAROSHD (FILTER) IMPLANT
GLOVE BIO SURGEON STRL SZ7 (GLOVE) ×1 IMPLANT
GLOVE BIOGEL PI IND STRL 7.0 (GLOVE) ×2 IMPLANT
GOWN STRL REUS W/ TWL LRG LVL3 (GOWN DISPOSABLE) ×3 IMPLANT
IRRIG SUCT STRYKERFLOW 2 WTIP (MISCELLANEOUS)
IRRIGATION SUCT STRKRFLW 2 WTP (MISCELLANEOUS) IMPLANT
KIT PINK PAD W/HEAD ARE REST (MISCELLANEOUS) ×1 IMPLANT
KIT PINK PAD W/HEAD ARM REST (MISCELLANEOUS) ×1 IMPLANT
KIT TURNOVER KIT B (KITS) ×1 IMPLANT
LIGASURE VESSEL 5MM BLUNT TIP (ELECTROSURGICAL) IMPLANT
MANIPULATOR UTERINE 4.5 ZUMI (MISCELLANEOUS) ×1 IMPLANT
NDL INSUFFLATION 14GA 120MM (NEEDLE) IMPLANT
NEEDLE INSUFFLATION 14GA 120MM (NEEDLE) ×1 IMPLANT
NS IRRIG 1000ML POUR BTL (IV SOLUTION) ×1 IMPLANT
PACK LAPAROSCOPY BASIN (CUSTOM PROCEDURE TRAY) ×1 IMPLANT
PROTECTOR NERVE ULNAR (MISCELLANEOUS) ×2 IMPLANT
SCISSORS LAP 5X35 DISP (ENDOMECHANICALS) IMPLANT
SET TUBE SMOKE EVAC HIGH FLOW (TUBING) ×1 IMPLANT
SLEEVE Z-THREAD 5X100MM (TROCAR) ×1 IMPLANT
SOL ELECTROSURG ANTI STICK (MISCELLANEOUS)
SOLUTION ELECTROSURG ANTI STCK (MISCELLANEOUS) IMPLANT
SUT MNCRL AB 4-0 PS2 18 (SUTURE) IMPLANT
SUT VICRYL 0 UR6 27IN ABS (SUTURE) ×1 IMPLANT
SUT VICRYL 4-0 PS2 18IN ABS (SUTURE) ×1 IMPLANT
SYS BAG RETRIEVAL 10MM (BASKET) ×1
SYSTEM BAG RETRIEVAL 10MM (BASKET) IMPLANT
TOWEL GREEN STERILE FF (TOWEL DISPOSABLE) ×2 IMPLANT
TROCAR BALLN 12MMX100 BLUNT (TROCAR) IMPLANT
TROCAR XCEL NON-BLD 5MMX100MML (ENDOMECHANICALS) ×1 IMPLANT
TROCAR Z-THREAD OPTICAL 5X100M (TROCAR) IMPLANT
WARMER LAPAROSCOPE (MISCELLANEOUS) ×1 IMPLANT

## 2023-04-14 NOTE — ED Provider Notes (Addendum)
 [facility] Provider Note   CSN: [id number] Arrival date & time: 04/14/23  9760     History  Chief Complaint  Patient presents with   Abdominal Pain    Danielle Christensen is a 33 y.o. female.  33 y/o female with hx of anemia, asthma presents to the ED for abdominal pain. Pain began yesterday morning and has been constant, worsening. Has tried Miralax for symptoms without relief. Pain aggravated by movement and walking; improved a bit when her hips are flexed. Patient had a syncopal event in the waiting room related to worsening pain which caused her to become lightheaded. Denies fever, V/D, urinary symptoms, vaginal bleeding, vaginal discharge. No prior abdominal surgeries. LMP unknown, per patient.   Abdominal Pain      Home Medications Prior to Admission medications   Not on File      Allergies    Amoxicillin and Mushroom extract complex (do not select)    Review of Systems   Review of Systems  Unable to perform ROS: Acuity of condition  Gastrointestinal:  Positive for abdominal pain.    Physical Exam Updated Vital Signs BP (!) 90/46   Pulse 93   Temp 98.1 F (36.7 C) (Oral)   Resp 17   SpO2 100%   Physical Exam Vitals and nursing note reviewed.  Constitutional:      General: She is not in acute distress.    Appearance: She is well-developed. She is ill-appearing. She is not diaphoretic.     Comments: Appears uncomfortable, nontoxic.  HENT:     Head: Normocephalic and atraumatic.  Eyes:     General: No scleral icterus.    Conjunctiva/sclera: Conjunctivae normal.  Cardiovascular:     Rate and Rhythm: Regular rhythm. Tachycardia present.     Pulses: Normal pulses.  Pulmonary:     Effort: Pulmonary effort is normal. No respiratory distress.     Comments: Respirations even and unlabored Abdominal:     Palpations: Abdomen is soft.     Comments: Abdomen soft with generalized TTP, guarding present. No rigidity  or masses.   Musculoskeletal:        General: Normal range of motion.     Cervical back: Normal range of motion.  Skin:    General: Skin is warm and dry.     Coloration: Skin is not pale.     Findings: No erythema or rash.  Neurological:     Mental Status: She is alert and oriented to person, place, and time.     Coordination: Coordination normal.  Psychiatric:        Behavior: Behavior normal.     ED Results / Procedures / Treatments   Labs (all labs ordered are listed, but only abnormal results are displayed) Labs Reviewed  CBC WITH DIFFERENTIAL/PLATELET - Abnormal; Notable for the following components:      Result Value   WBC 12.7 (*)    RBC 3.61 (*)    Hemoglobin 11.1 (*)    HCT 33.1 (*)    Neutro Abs 10.9 (*)    All other components within normal limits  COMPREHENSIVE METABOLIC PANEL - Abnormal; Notable for the following components:   Potassium 3.3 (*)    CO2 20 (*)    Glucose, Bld 173 (*)    Creatinine, Ser 1.22 (*)    Calcium 8.8 (*)    Total Protein 6.1 (*)    Albumin 3.2 (*)    All other components within  normal limits  HCG, SERUM, QUALITATIVE - Abnormal; Notable for the following components:   Preg, Serum POSITIVE (*)    All other components within normal limits  CBG MONITORING, ED - Abnormal; Notable for the following components:   Glucose-Capillary 155 (*)    All other components within normal limits  URINALYSIS, ROUTINE W REFLEX MICROSCOPIC  SAMPLE TO BLOOD BANK  TYPE AND SCREEN    EKG EKG Interpretation Date/Time:  Friday April 14 2023 02:49:47 EST Ventricular Rate:  102 PR Interval:  147 QRS Duration:  87 QT Interval:  316 QTC Calculation: 412 R Axis:   35  Text Interpretation: Sinus tachycardia Nonspecific T abnormalities, diffuse leads No previous tracing Confirmed by Haze Lonni PARAS [phone]) on 04/14/2023 2:52:04 AM  Radiology No results found.  Procedures .Critical Care  Performed by: Keith Sor, PA-C Authorized by: Keith Sor, PA-C   Critical care provider statement:    Critical care time (minutes):  30   Critical care was necessary to treat or prevent imminent or life-threatening deterioration of the following conditions: suspected ruptured ectopic pregnancy.   Critical care was time spent personally by me on the following activities:  Development of treatment plan with patient or surrogate, discussions with consultants, evaluation of patient's response to treatment, examination of patient, ordering and review of laboratory studies, ordering and review of radiographic studies, ordering and performing treatments and interventions, pulse oximetry, re-evaluation of patient's condition, review of old charts and obtaining history from patient or surrogate   I assumed direction of critical care for this patient from another provider in my specialty: no       Medications Ordered in ED Medications  HYDROmorphone  (DILAUDID ) injection 1 mg (1 mg Intravenous Given 04/14/23 0310)  ondansetron  (ZOFRAN ) injection 4 mg (4 mg Intravenous Given 04/14/23 0310)  sodium chloride  0.9 % bolus 2,000 mL (2,000 mLs Intravenous New Bag/Given 04/14/23 0310)  sodium chloride  0.9 % bolus 1,000 mL (1,000 mLs Intravenous New Bag/Given 04/14/23 0439)    ED Course/ Medical Decision Making/ A&P Clinical Course as of 04/14/23 0442  Fri Apr 14, 2023  0305 BP improved to 90's systolic with IVF [KH]  9662 Pregnancy positive. US  orders changed. [KH]  9658 Spoke with Lauraine, APP at [facility]U who has accepted patient to MAU for further evaluation upon return from ultrasound. [KH]  [phone] Patient being transported back from US . Will repeat vitals and move to MAU if stable. [KH]  0434 Systolic BP holding in 90's. HR now ~93-95bpm. Stable for transport. [KH]    Clinical Course User Index [KH] Keith Sor, PA-C                                 Medical Decision Making Amount and/or Complexity of Data Reviewed Labs: ordered. Radiology:  ordered.  Risk Prescription drug management. Decision regarding hospitalization.   This patient presents to the ED for concern of abdominal pain, this involves an extensive number of treatment options, and is a complaint that carries with it a high risk of complications and morbidity.  The differential diagnosis includes ovarian torsion vs PID/TOA vs ectopic pregnancy vs ruptured viscous vs pSBO/SBO   Co morbidities that complicate the patient evaluation  Anemia Asthma   Additional history obtained:  Additional history obtained from mother, at bedside   Lab Tests:  I Ordered, and personally interpreted labs.  The pertinent results include:  WBC 12.7, Hgb 11.1, K 3.3, CO2 20,  Creatinine 1.22, Pregnancy positive.   Imaging Studies ordered:  I ordered imaging studies including OB ultrasound  I independently visualized and interpreted imaging which showed concern for ruptured ectopic pregnancy I agree with the radiologist interpretation   Cardiac Monitoring:  The patient was maintained on a cardiac monitor.  I personally viewed and interpreted the cardiac monitored which showed an underlying rhythm of: sinus tachycardia   Medicines ordered and prescription drug management:  I ordered medication including Dilaudid  for pain and Zofran  for nausea  Reevaluation of the patient after these medicines showed that the patient improved I have reviewed the patients home medicines and have made adjustments as needed   Critical Interventions:  Aggressive IVF resuscitation for hypotension in the setting of suspected ruptured ectopic pregnancy   Consultations Obtained:  I requested consultation with Lauraine, APP at [facility]U, and discussed lab and imaging findings as well as pertinent plan - they have accepted the patient in transfer for evaluation   Problem List / ED Course:  As above   Reevaluation:  After the interventions noted above, I reevaluated the patient and found that  they have : remained stable   Dispostion:  Patient transferred to MAU for ongoing care.         Final Clinical Impression(s) / ED Diagnoses Final diagnoses:  Pelvic pain in female  Positive pregnancy test    Rx / DC Orders ED Discharge Orders     None         Keith Sor, PA-C 04/14/23 0442    Keith Sor, PA-C 04/14/23 9373    Haze Lonni PARAS, MD 04/14/23 [phone]

## 2023-04-14 NOTE — Transfer of Care (Signed)
 Immediate Anesthesia Transfer of Care Note  Patient: Danielle Christensen  Procedure(s) Performed: DIAGNOSTIC LAPAROSCOPY WITH REMOVAL OF ECTOPIC PREGNANCY SALPINGECTOMY  Patient Location: PACU  Anesthesia Type:General  Level of Consciousness: awake, alert , and oriented  Airway & Oxygen Therapy: Patient Spontanous Breathing and Patient connected to face mask oxygen  Post-op Assessment: Report given to RN and Post -op Vital signs reviewed and stable  Post vital signs: Reviewed and stable  Last Vitals:  Vitals Value Taken Time  BP 122/63 04/14/23 0930  Temp 36.4 C 04/14/23 0927  Pulse 100 04/14/23 0940  Resp 9 04/14/23 0940  SpO2 100 % 04/14/23 0940  Vitals shown include unfiled device data.  Last Pain:  Vitals:   04/14/23 0700  TempSrc: Oral  PainSc: 10-Worst pain ever      Patients Stated Pain Goal: 0 (04/14/23 0700)  Complications: No notable events documented.

## 2023-04-14 NOTE — MAU Note (Signed)
Pt is being transported to OR.

## 2023-04-14 NOTE — Discharge Instructions (Signed)
 Post-Operative Discharge Instructions  Surgery: Diagnostic Laparoscopy with right salpingectomy for ruptured tubal ectopic pregnancy   Surgeon: [name], MD  Please follow the instructions below during your recovery period. If you have questions or concerns please call our office at [phone].  Please look out for the following and call the office immediately or go to nearest Emergency Room if you experience: - Chest pain - Shortness of breath - Fever >100.4 F  - Persistent nausea and/or vomiting - Difficulty urinating or not urinating for >6 hours  - Severe abdominal or pelvic pain not responsive to recommended pain medications - Heavy vaginal bleeding (soaking >2 pads per hour)  - Pain, swelling, and/or redness in one leg   Activity: You may resume normal activity as tolerated with the the following exceptions: no lifting objects >10lbs for two weeks, nothing in the vagina for two weeks. We recommend walking as soon and as much as you are comfortably able.  Please do not put in anything in the vagina for 2 weeks after your surgery. This includes: - No intercourse - No tampons - No douching - No sitting in water (e.g. pools, baths, etc.)   Incisions: You're incisions have surgical glue over them. You may shower and let warm soapy water run over incisions, do not scrub. Pat dry and leave open to the air. The glue will start to peel off on its own as your incisions heal.  Expectations: - Light vaginal bleeding is normal after your procedure and should resolve in the next few days. - Some abdominal soreness and bloating is normal. Please see below for recommended medications.  - Feeling more tired than usual for a few days following anesthesia is normal.   Diet: You may resume normal diet immediately after surgery. Anesthesia can make you feel nauseous, we recommend avoiding spicy or acidic foods for the first 72 hours after surgery. Drink lots of water (64oz/day).    Pain: We expect you to have some abdominal/pelvic pain and cramping for a few days after your surgery. We recommend taking medication around the clock for the first 48-72 hours after your surgery as follows: - Acetaminophen  (Tylenol ) 1000mg  every 6 hours  - Ibuprofen  (Advil  or Motrin ) 600mg  every 6 hours - Gabapentin  300mg  every 8 hours for 3 days after surgery - Oxycodone  5mg  every 4 hours as needed for breakthrough pain  - Miralax or other stool softener to keep bowel movements soft and avoid straining   We wish you a speedy recovery!

## 2023-04-14 NOTE — Anesthesia Preprocedure Evaluation (Addendum)
 Anesthesia Evaluation  Patient identified by MRN, date of birth, ID band Patient awake    Reviewed: Allergy & Precautions, NPO status , Patient's Chart, lab work & pertinent test results  History of Anesthesia Complications Negative for: history of anesthetic complications  Airway Mallampati: III  TM Distance: >3 FB Neck ROM: Full   Comment: Permanent retainer on the bottom Dental  (+) Dental Advisory Given   Pulmonary shortness of breath, asthma    Pulmonary exam normal breath sounds clear to auscultation       Cardiovascular negative cardio ROS  Rhythm:Regular Rate:Normal     Neuro/Psych negative neurological ROS     GI/Hepatic negative GI ROS, Neg liver ROS,,,  Endo/Other  negative endocrine ROS    Renal/GU negative Renal ROS     Musculoskeletal   Abdominal   Peds  Hematology  (+) Blood dyscrasia, anemia Lab Results      Component                Value               Date                      WBC                      10.9 (H)            04/14/2023                HGB                      8.5 (L)             04/14/2023                HCT                      25.7 (L)            04/14/2023                MCV                      92.1                04/14/2023                PLT                      172                 04/14/2023              Anesthesia Other Findings Ruptured ectopic, Hgb 11.1 --> 8.5  Reproductive/Obstetrics                              Anesthesia Physical Anesthesia Plan  ASA: 2  Anesthesia Plan: General   Post-op Pain Management:    Induction: Intravenous and Rapid sequence  PONV Risk Score and Plan: 3 and Ondansetron , Dexamethasone  and Treatment may vary due to age or medical condition  Airway Management Planned: Oral ETT  Additional Equipment:   Intra-op Plan:   Post-operative Plan: Extubation in OR  Informed Consent: I have reviewed the patients  History and Physical, chart, labs and discussed the procedure including the risks, benefits and alternatives for  the proposed anesthesia with the patient or authorized representative who has indicated his/her understanding and acceptance.     Dental advisory given  Plan Discussed with: CRNA and Anesthesiologist  Anesthesia Plan Comments: (Plan for blood in room. Will start transfusing once in OR. 2 PIVs.  Risks of general anesthesia discussed including, but not limited to, sore throat, hoarse voice, chipped/damaged teeth, injury to vocal cords, nausea and vomiting, allergic reactions, lung infection, heart attack, stroke, and death. All questions answered. )         Anesthesia Quick Evaluation

## 2023-04-14 NOTE — MAU Note (Signed)
.  Danielle Christensen is a 33 y.o. at Unknown here in MAU reporting: Patient arrived to MAU 0448 via stretcher from main ED, patient had + serum pregnancy in main ED, reports being unaware of pregnancy until ED. Unknown LMP, per patient probably some time in December.   Patient went to main ED today around 0200 for generalized abd pain, reports it is worse with movement and generalized across her abdomen. Patient reports constipation since Sunday, reports took Miralax today and had small BM in ED but did not help with pain. Patient reports kinda passed out when she was in Main ED.   Patient denies LOF and VB   LMP: December/unknown  Onset of complaint: Sunday  Pain score: 10/10 abd pain with movement  Vitals:   04/14/23 0320 04/14/23 0430  BP:  (!) 90/46  Pulse: 96 93  Resp: 19 17  Temp:    SpO2: 100% 100%     FHT:n/a  Lab orders placed from triage: n/a

## 2023-04-14 NOTE — Anesthesia Postprocedure Evaluation (Signed)
 Anesthesia Post Note  Patient: Danielle Christensen  Procedure(s) Performed: DIAGNOSTIC LAPAROSCOPY WITH REMOVAL OF ECTOPIC PREGNANCY SALPINGECTOMY     Patient location during evaluation: PACU Anesthesia Type: General Level of consciousness: awake Pain management: pain level controlled Vital Signs Assessment: post-procedure vital signs reviewed and stable Respiratory status: spontaneous breathing, nonlabored ventilation and respiratory function stable Cardiovascular status: blood pressure returned to baseline and stable Postop Assessment: no apparent nausea or vomiting Anesthetic complications: no   No notable events documented.  Last Vitals:  Vitals:   04/14/23 1100 04/14/23 1109  BP: 98/72 (!) 111/91  Pulse: 98 (!) 105  Resp: 13   Temp:    SpO2: 93% 95%    Last Pain:  Vitals:   04/14/23 1100  TempSrc:   PainSc: Asleep                 Delon Aisha Arch

## 2023-04-14 NOTE — Op Note (Signed)
 Operative Note  Procedure: Diagnostic laparoscopy, right salpingectomy  Indications: Ruptured ectopic pregnancy   Pre-operative Diagnosis: Ruptured ectopic pregnancy  Post-operative Diagnosis: same  Surgeon: Hadassah DELENA Springer, MD  Assistants: Robbi Render, MD  Anesthesia: General endotracheal anesthesia  Findings: EUA: Normal external female genitalia. Normal vagina and cervix.  Intra-op: Large volume hemoperitoneum in pelvis with mix of older clots and new blood. 750cc of hemoperitoneum suctioned. Ectopic pregnancy identified in right fallopian tube isthmus, near cornua, noted to be rupturing out of tube. Normal left fallopian tube, bilateral ovaries, and uterus. Other than hemoperitoneum, grossly normal abdominal survey including normal appearing bowel and liver edge. 1u pRBC transfused intraoperatively.   Estimated Blood Loss:  Minimal from case; 1L hemoperitoneum          Drains: Foley catheter, removed at end of case         Total IV Fluids:  per anesthesia  Procedure Details   The patient was seen in the pre-operative area. Risks, benefits, and alternatives of the procedure were reviewed and informed consent was signed and witnessed. Patient proceeded to operating room where general endotracheal anesthesia was obtained without complication. SCD boots were placed and turned on. Foley catheter was placed. The patient was prepped and draped in usual sterile manner. A sponge stick was placed in the vagina for uterine manipulation.   Veress needle was inserted at the umbilicus and gas attached with initial pressure <55mmHg. Abdomen was then insufflated to . 5mm trocar placed at umbilicus under direct visualization. Two additional 5mm trocars were placed under direct visualization in the right and left lower quadrants. Patient placed in trendelenburg and bowel swept out of the field. Findings as above.  Hemoperitoneum was suctioned. The Ligasure was used to transect the right  fallopian tube from the mesosalpinx and amputate at the cornua. The umbilical port was upsized to 10mm port and specimen was removed through port using an endocatch bag and sent to pathology. Hemostatic at end of case. 10 mL of 0.25% marcained were placed in the peritoneal cavity. All instruments and trocars were removed. The umbilical incision fascia was closed using 0-vicryl. Incisions were closed using 4-0 monocryl and dressed with Dermabond. 0.25% marcaine  injected at all incision sites.    The uterine manipulator was removed from the vagina and cervical hemostasis ensured. Foley catheter was removed.   Instrument, sponge, and needle counts were correct prior the abdominal closure and at the conclusion of the case.          Specimens: Bilateral fallopian tubes         Implants: None         Complications:  None; patient tolerated the procedure well.         Disposition: PACU - hemodynamically stable.         Condition: stable  Hadassah DELENA Springer, MD

## 2023-04-14 NOTE — ED Notes (Signed)
 MAU RN Ana updated on current VS. Pt transported to MAU room 124

## 2023-04-14 NOTE — Anesthesia Procedure Notes (Signed)
 Procedure Name: Intubation Date/Time: 04/14/2023 7:48 AM  Performed by: Albertia Carvin J, CRNAPre-anesthesia Checklist: Patient identified, Emergency Drugs available, Suction available and Patient being monitored Patient Re-evaluated:Patient Re-evaluated prior to induction Oxygen Delivery Method: Circle System Utilized Preoxygenation: Pre-oxygenation with 100% oxygen Induction Type: IV induction and Rapid sequence Ventilation: Mask ventilation without difficulty Laryngoscope Size: Glidescope and 3 Grade View: Grade I Tube type: Oral Number of attempts: 1 Airway Equipment and Method: Stylet and Oral airway Placement Confirmation: ETT inserted through vocal cords under direct vision, positive ETCO2 and breath sounds checked- equal and bilateral Secured at: 21 cm Tube secured with: Tape Dental Injury: Teeth and Oropharynx as per pre-operative assessment  Comments: Initial DL with miller 3 epiglottis only view, Deep. MDA DL with MAC epiglottis only view. Easy glide scope intubation

## 2023-04-14 NOTE — MAU Provider Note (Signed)
 None     [name] is a 33 y.o. [phone] pregnant/non-pregnant female at Unknown who presents to MAU today with complaint of severe abdominal pain in the context of a newly diagnosed pregnancy of unknown origin. ED provider Burnard called report top MAU: patient presented with intense abdominal pain. Positive UPT in ED. Near-syncopal episode with hypotensive BP readings.   Receives care at [facility]. Prenatal records reviewed.  Pertinent items noted in HPI and remainder of comprehensive ROS otherwise negative.   O BP 111/73   Pulse 98   Temp 98.1 F (36.7 C) (Oral)   Resp 17   LMP  (LMP Unknown)   SpO2 100%  Physical Exam Vitals reviewed.  Constitutional:      Appearance: Normal appearance. She is ill-appearing.  HENT:     Head: Normocephalic.  Cardiovascular:     Rate and Rhythm: Normal rate.     Pulses: Normal pulses.  Pulmonary:     Effort: Pulmonary effort is normal.  Skin:    General: Skin is warm and dry.     Capillary Refill: Capillary refill takes less than 2 seconds.  Neurological:     Mental Status: She is alert and oriented to person, place, and time.  Psychiatric:        Mood and Affect: Mood normal.        Behavior: Behavior normal.        Thought Content: Thought content normal.        Judgment: Judgment normal.    Results for orders placed or performed during the hospital encounter of 04/14/23 (from the past 24 hours)  CBG monitoring, ED     Status: Abnormal   Collection Time: 04/14/23  2:49 AM  Result Value Ref Range   Glucose-Capillary 155 (H) 70 - 99 mg/dL  CBC with Differential     Status: Abnormal   Collection Time: 04/14/23  2:52 AM  Result Value Ref Range   WBC 12.7 (H) 4.0 - 10.5 K/uL   RBC 3.61 (L) 3.87 - 5.11 MIL/uL   Hemoglobin 11.1 (L) 12.0 - 15.0 g/dL   HCT 66.8 (L) 63.9 - 53.9 %   MCV 91.7 80.0 - 100.0 fL   MCH 30.7 26.0 - 34.0 pg   MCHC 33.5 30.0 - 36.0 g/dL   RDW 87.2 88.4 - 84.4 %   Platelets 268 150 - 400 K/uL    nRBC 0.0 0.0 - 0.2 %   Neutrophils Relative % 86 %   Neutro Abs 10.9 (H) 1.7 - 7.7 K/uL   Lymphocytes Relative 9 %   Lymphs Abs 1.2 0.7 - 4.0 K/uL   Monocytes Relative 5 %   Monocytes Absolute 0.6 0.1 - 1.0 K/uL   Eosinophils Relative 0 %   Eosinophils Absolute 0.0 0.0 - 0.5 K/uL   Basophils Relative 0 %   Basophils Absolute 0.0 0.0 - 0.1 K/uL   Immature Granulocytes 0 %   Abs Immature Granulocytes 0.05 0.00 - 0.07 K/uL  Comprehensive metabolic panel     Status: Abnormal   Collection Time: 04/14/23  2:52 AM  Result Value Ref Range   Sodium 135 135 - 145 mmol/L   Potassium 3.3 (L) 3.5 - 5.1 mmol/L   Chloride 102 98 - 111 mmol/L   CO2 20 (L) 22 - 32 mmol/L   Glucose, Bld 173 (H) 70 - 99 mg/dL   BUN 9 6 - 20 mg/dL   Creatinine, Ser 8.77 (H) 0.44 - 1.00 mg/dL  Calcium 8.8 (L) 8.9 - 10.3 mg/dL   Total Protein 6.1 (L) 6.5 - 8.1 g/dL   Albumin 3.2 (L) 3.5 - 5.0 g/dL   AST 25 15 - 41 U/L   ALT 20 0 - 44 U/L   Alkaline Phosphatase 43 38 - 126 U/L   Total Bilirubin 0.6 0.0 - 1.2 mg/dL   GFR, Estimated >39 >39 mL/min   Anion gap 13 5 - 15  hCG, quantitative, pregnancy     Status: Abnormal   Collection Time: 04/14/23  2:52 AM  Result Value Ref Range   hCG, Beta Chain, Quant, S 5,448 (H) <5 mIU/mL  hCG, serum, qualitative     Status: Abnormal   Collection Time: 04/14/23  3:01 AM  Result Value Ref Range   Preg, Serum POSITIVE (A) NEGATIVE  Sample to Blood Bank     Status: None   Collection Time: 04/14/23  3:19 AM  Result Value Ref Range   Blood Bank Specimen SAMPLE AVAILABLE FOR TESTING    Sample Expiration      04/17/2023,2359 Performed at [facility], [address] [place], [place] [address]   Type and screen [facility]     Status: None   Collection Time: 04/14/23  3:19 AM  Result Value Ref Range   ABO/RH(D) A POS    Antibody Screen NEG    Sample Expiration      04/17/2023,2359 Performed at [facility], [address] [place],  [place] [address]   CBC     Status: Abnormal   Collection Time: 04/14/23  5:18 AM  Result Value Ref Range   WBC 10.9 (H) 4.0 - 10.5 K/uL   RBC 2.79 (L) 3.87 - 5.11 MIL/uL   Hemoglobin 8.5 (L) 12.0 - 15.0 g/dL   HCT 74.2 (L) 63.9 - 53.9 %   MCV 92.1 80.0 - 100.0 fL   MCH 30.5 26.0 - 34.0 pg   MCHC 33.1 30.0 - 36.0 g/dL   RDW 87.3 88.4 - 84.4 %   Platelets 172 150 - 400 K/uL   nRBC 0.0 0.0 - 0.2 %    MDM: Reviewed ED course Images reviewed with Dr. Erik Dorthea Ina, CBC ordered independently here for trend  In context of severe abdominal pain, elevated quant and dropping Hgb recommendation from Dr. Erik to call Dr. Fonnie on call for [facility] for evaluation of likely ruptured ectopic pregnancy  MAU Course:  A Ruptured ectopic pregnancy Medical screening exam  complete  P Call to Dr. Fonnie with diagnosis of likely ruptured ectopic pregnancy to evaluate for surgery. Care turned over to Dr. Fonnie at [phone]   Regino Camie LABOR, CNM 04/14/2023 6:07 AM

## 2023-04-14 NOTE — ED Triage Notes (Signed)
 Went to retrieve pt from the lobby and pt had syncopal episode and was lying on the floor. Assisted to chair and taken to room for triage.  Pt is alert but not answering questions.  Mom at bedside reports patient has been constipated, has taken miralax, and was able to use the restroom some but has continued to have severe abdominal pain.

## 2023-04-14 NOTE — H&P (Signed)
 GYN H&P  S: Danielle Christensen is a 32yo [id number] at unknown [place] presenting with abdominal pain. Patient reports severe abdominal pain started early this morning, 10/10. Received dialudid in ED, which improved pain. Denies vaginal bleeding. Is feeling lightheaded, passed out earlier in ED. Also notes some shortness of breath.   O:     04/14/2023    6:09 AM 04/14/2023    5:30 AM 04/14/2023    4:55 AM  Vitals with BMI  Systolic 90 111 112  Diastolic 49 73 70  Pulse 95 98 97    PE:  GAD: ill appearing, pale  Chest: normal work of breathing on room air Abd: soft, tender throughout, mildly distended  Labs:     Latest Ref Rng & Units 04/14/2023    5:18 AM 04/14/2023    2:52 AM 05/08/2015    6:18 AM  CBC  WBC 4.0 - 10.5 K/uL 10.9  12.7  14.4   Hemoglobin 12.0 - 15.0 g/dL 8.5  88.8  89.3   Hematocrit 36.0 - 46.0 % 25.7  33.1  32.3   Platelets 150 - 400 K/uL 172  268  158    HCG 5448    Imaging: TVUS: MPRESSION: 1. No IUP identified. Uterus and ovaries appear within normal limits. Differential considerations include early IUP, failed IUP, occult ectopic pregnancy. Recommend serial quantitative beta HCG and repeat ultrasound as necessary. 2. Large volume heterogeneous and nonvascular material in the pelvis, posterior to the uterus nonspecific. Top differential considerations include pelvic hematoma and stool impacted rectum. Recommend correlation with hemoglobin/hematocrit. 3. Echogenic debris in the urinary bladder suspicious for UTI, correlate with urinalysis.  A/P: 71bn [id number] F at unknown GA presenting with ruptured ectopic. Vitals notable for hypotension. Labs notable for drop in hemoglobin from 11.1 to 8.5 over 2 hours. Imaging notable for large volume heterogeneous fluid. No IUP. Plan for emergency diagnostic laparoscopy and salpingectomy. Continue IV fluids. Plan to start blood transfusion in OR.  To OR emergently.  Danielle Springer, MD

## 2023-04-14 NOTE — Progress Notes (Signed)
 Post-Op Check   S: 32yo [id number] POD#0 s/p diagnostic laparoscopy with right salpingectomy and evacuation of hemoperitoneum for ruptured tubal ectopic pregnancy. Patient seen and examined at bedside. Reports feeling overall improved. She is having pain at umbilical incision but reports her abdominal pain is improved from pre-op. Tolerating sips of water without ausea or vomiting. Has not yet ambulated or voided.   O:  Vitals:   04/14/23 1139 04/14/23 1145  BP: 101/66   Pulse: (!) 107 (!) 103  Resp:    Temp:    SpO2: 96% 93%   PE GA: well appearing, NAD Abd: soft, appropriately tender, midlly distended Inc: 3 laparoscopic incisions clean/dry/intact Ext: no TTP   Lab Results  Component Value Date   WBC 11.8 (H) 04/14/2023   HGB 10.7 (L) 04/14/2023   HCT 32.3 (L) 04/14/2023   MCV 92.6 04/14/2023   PLT 193 04/14/2023   A/P: 32yo [id number] POD#0 s/p diagnostic laparoscopy with R salpingectomy and evacuation of hemoperitoneum for ruptured tubal ectopic, doing well and progressing appropriately. Exam benign, vitals stable. Post-op CBC with appropriate Hgb 10.7. Plan for discharge home after patient ambulates and voids. Return precautions, recovery and follow-up reviewed with patient.   [name], MD

## 2023-04-15 ENCOUNTER — Encounter (HOSPITAL_COMMUNITY): Payer: Self-pay | Admitting: Obstetrics

## 2023-04-15 LAB — BPAM RBC
Blood Product Expiration Date: 202501292359
Blood Product Expiration Date: 202501292359
Blood Product Expiration Date: 202502012359
Blood Product Expiration Date: 202502012359
Blood Product Expiration Date: 202502072359
Blood Product Expiration Date: 202502072359
ISSUE DATE / TIME: 202501100719
ISSUE DATE / TIME: 202501100719
ISSUE DATE / TIME: 202501100733
ISSUE DATE / TIME: 202501100733
Unit Type and Rh: 6200
Unit Type and Rh: 6200
Unit Type and Rh: 6200
Unit Type and Rh: 6200
Unit Type and Rh: 6200
Unit Type and Rh: 6200

## 2023-04-15 LAB — TYPE AND SCREEN
ABO/RH(D): A POS
Antibody Screen: NEGATIVE
Unit division: 0
Unit division: 0
Unit division: 0
Unit division: 0
Unit division: 0
Unit division: 0

## 2023-04-18 LAB — SURGICAL PATHOLOGY
